# Patient Record
Sex: Female | Born: 1940 | Race: Black or African American | Hispanic: No | Smoking: Former smoker
Health system: Southern US, Community
[De-identification: ages and names within clinical notes are randomized; demographics above are authoritative.]

## PROBLEM LIST (undated history)

## (undated) DIAGNOSIS — R42 Dizziness and giddiness: Secondary | ICD-10-CM

## (undated) DIAGNOSIS — H8110 Benign paroxysmal vertigo, unspecified ear: Secondary | ICD-10-CM

## (undated) DIAGNOSIS — I10 Essential (primary) hypertension: Secondary | ICD-10-CM

## (undated) DIAGNOSIS — E039 Hypothyroidism, unspecified: Secondary | ICD-10-CM

## (undated) DIAGNOSIS — E119 Type 2 diabetes mellitus without complications: Secondary | ICD-10-CM

## (undated) DIAGNOSIS — I2510 Atherosclerotic heart disease of native coronary artery without angina pectoris: Secondary | ICD-10-CM

## (undated) DIAGNOSIS — F419 Anxiety disorder, unspecified: Secondary | ICD-10-CM

## (undated) HISTORY — PX: ABDOMINAL HYSTERECTOMY: SHX81

## (undated) HISTORY — DX: Benign paroxysmal vertigo, unspecified ear: H81.10

## (undated) HISTORY — PX: CHOLECYSTECTOMY: SHX55

## (undated) HISTORY — DX: Anxiety disorder, unspecified: F41.9

## (undated) HISTORY — PX: CORONARY ARTERY BYPASS GRAFT: SHX141

## (undated) HISTORY — DX: Essential (primary) hypertension: I10

## (undated) HISTORY — PX: APPENDECTOMY: SHX54

## (undated) HISTORY — DX: Hypothyroidism, unspecified: E03.9

## (undated) HISTORY — DX: Dizziness and giddiness: R42

## (undated) HISTORY — DX: Atherosclerotic heart disease of native coronary artery without angina pectoris: I25.10

## (undated) HISTORY — PX: TUBAL LIGATION: SHX77

## (undated) HISTORY — DX: Type 2 diabetes mellitus without complications: E11.9

---

## 1997-05-20 ENCOUNTER — Other Ambulatory Visit: Admission: RE | Admit: 1997-05-20 | Discharge: 1997-05-20 | Payer: Self-pay | Admitting: Obstetrics and Gynecology

## 1997-07-23 ENCOUNTER — Other Ambulatory Visit: Admission: RE | Admit: 1997-07-23 | Discharge: 1997-07-23 | Payer: Self-pay | Admitting: Obstetrics and Gynecology

## 1999-08-17 ENCOUNTER — Encounter: Admission: RE | Admit: 1999-08-17 | Discharge: 1999-08-17 | Payer: Self-pay | Admitting: Obstetrics and Gynecology

## 1999-08-17 ENCOUNTER — Encounter: Payer: Self-pay | Admitting: Obstetrics and Gynecology

## 2000-05-08 ENCOUNTER — Encounter: Admission: RE | Admit: 2000-05-08 | Discharge: 2000-05-08 | Payer: Self-pay | Admitting: Obstetrics and Gynecology

## 2000-05-08 ENCOUNTER — Encounter: Payer: Self-pay | Admitting: Obstetrics and Gynecology

## 2001-07-16 ENCOUNTER — Emergency Department (HOSPITAL_COMMUNITY): Admission: EM | Admit: 2001-07-16 | Discharge: 2001-07-16 | Payer: Self-pay | Admitting: Emergency Medicine

## 2010-04-21 ENCOUNTER — Emergency Department (HOSPITAL_COMMUNITY): Payer: Medicare Other

## 2010-04-21 ENCOUNTER — Observation Stay (HOSPITAL_COMMUNITY)
Admission: EM | Admit: 2010-04-21 | Discharge: 2010-04-24 | Disposition: A | Discharge status: Home / Self Care | Payer: Medicare Other | Attending: Internal Medicine | Admitting: Internal Medicine

## 2010-04-21 DIAGNOSIS — E119 Type 2 diabetes mellitus without complications: Secondary | ICD-10-CM | POA: Insufficient documentation

## 2010-04-21 DIAGNOSIS — I2510 Atherosclerotic heart disease of native coronary artery without angina pectoris: Secondary | ICD-10-CM | POA: Insufficient documentation

## 2010-04-21 DIAGNOSIS — Z79899 Other long term (current) drug therapy: Secondary | ICD-10-CM | POA: Insufficient documentation

## 2010-04-21 DIAGNOSIS — Z23 Encounter for immunization: Secondary | ICD-10-CM | POA: Insufficient documentation

## 2010-04-21 DIAGNOSIS — I10 Essential (primary) hypertension: Secondary | ICD-10-CM | POA: Insufficient documentation

## 2010-04-21 DIAGNOSIS — F411 Generalized anxiety disorder: Secondary | ICD-10-CM | POA: Insufficient documentation

## 2010-04-21 DIAGNOSIS — E039 Hypothyroidism, unspecified: Secondary | ICD-10-CM | POA: Insufficient documentation

## 2010-04-21 DIAGNOSIS — Z7982 Long term (current) use of aspirin: Secondary | ICD-10-CM | POA: Insufficient documentation

## 2010-04-21 DIAGNOSIS — Z951 Presence of aortocoronary bypass graft: Secondary | ICD-10-CM | POA: Insufficient documentation

## 2010-04-21 DIAGNOSIS — H409 Unspecified glaucoma: Secondary | ICD-10-CM | POA: Insufficient documentation

## 2010-04-21 DIAGNOSIS — H532 Diplopia: Secondary | ICD-10-CM | POA: Insufficient documentation

## 2010-04-21 DIAGNOSIS — R209 Unspecified disturbances of skin sensation: Secondary | ICD-10-CM | POA: Insufficient documentation

## 2010-04-21 DIAGNOSIS — E7800 Pure hypercholesterolemia, unspecified: Secondary | ICD-10-CM | POA: Insufficient documentation

## 2010-04-21 DIAGNOSIS — H8110 Benign paroxysmal vertigo, unspecified ear: Principal | ICD-10-CM | POA: Insufficient documentation

## 2010-04-21 LAB — PROTIME-INR
INR: 0.98 (ref 0.00–1.49)
Prothrombin Time: 13.2 seconds (ref 11.6–15.2)

## 2010-04-21 LAB — POCT CARDIAC MARKERS
CKMB, poc: <1 ng/mL — ABNORMAL LOW (ref 1.0–8.0)
Myoglobin, poc: 54.9 ng/mL (ref 12–200)
Troponin i, poc: <0.05 ng/mL (ref 0.00–0.09)

## 2010-04-21 LAB — CBC
HCT: 39 % (ref 36.0–46.0)
Hemoglobin: 12.8 g/dL (ref 12.0–15.0)
MCH: 27 pg (ref 26.0–34.0)
MCHC: 32.8 g/dL (ref 30.0–36.0)
MCV: 82.3 fL (ref 78.0–100.0)
Platelets: 217 10*3/uL (ref 150–400)
RBC: 4.74 MIL/uL (ref 3.87–5.11)
RDW: 15.4 % (ref 11.5–15.5)
WBC: 9 10*3/uL (ref 4.0–10.5)

## 2010-04-21 LAB — URINALYSIS, ROUTINE W REFLEX MICROSCOPIC
Nitrite: NEGATIVE
Specific Gravity, Urine: 1.018 (ref 1.005–1.030)
pH: 6 (ref 5.0–8.0)

## 2010-04-21 LAB — APTT: aPTT: 26 seconds (ref 24–37)

## 2010-04-21 LAB — BASIC METABOLIC PANEL
Chloride: 104 mEq/L (ref 96–112)
GFR calc non Af Amer: 51 mL/min — ABNORMAL LOW (ref >60)
Potassium: 3.8 mEq/L (ref 3.5–5.1)
Sodium: 138 mEq/L (ref 135–145)

## 2010-04-21 LAB — DIFFERENTIAL
Basophils Absolute: 0 10*3/uL (ref 0.0–0.1)
Basophils Relative: 0 % (ref 0–1)
Eosinophils Absolute: 0.1 10*3/uL (ref 0.0–0.7)
Eosinophils Relative: 1 % (ref 0–5)
Lymphocytes Relative: 29 % (ref 12–46)
Lymphs Abs: 2.6 10*3/uL (ref 0.7–4.0)
Monocytes Absolute: 0.3 10*3/uL (ref 0.1–1.0)
Monocytes Relative: 3 % (ref 3–12)
Neutro Abs: 6 10*3/uL (ref 1.7–7.7)
Neutrophils Relative %: 67 % (ref 43–77)

## 2010-04-21 LAB — HEMOGLOBIN A1C: Mean Plasma Glucose: 126 mg/dL — ABNORMAL HIGH (ref <117)

## 2010-04-21 MED ORDER — GADOBENATE DIMEGLUMINE 529 MG/ML IV SOLN
20.0000 mL | Freq: Once | INTRAVENOUS | Status: AC
  Administered 2010-04-21: 20 mL via INTRAVENOUS

## 2010-04-22 ENCOUNTER — Observation Stay (HOSPITAL_COMMUNITY): Payer: Medicare Other

## 2010-04-22 DIAGNOSIS — G459 Transient cerebral ischemic attack, unspecified: Secondary | ICD-10-CM

## 2010-04-22 DIAGNOSIS — I369 Nonrheumatic tricuspid valve disorder, unspecified: Secondary | ICD-10-CM

## 2010-04-22 LAB — COMPREHENSIVE METABOLIC PANEL
AST: 20 U/L (ref 0–37)
BUN: 13 mg/dL (ref 6–23)
CO2: 28 mEq/L (ref 19–32)
Chloride: 105 mEq/L (ref 96–112)
Creatinine, Ser: 1.13 mg/dL (ref 0.4–1.2)
GFR calc Af Amer: 57 mL/min — ABNORMAL LOW (ref >60)
GFR calc non Af Amer: 47 mL/min — ABNORMAL LOW (ref >60)
Total Bilirubin: 0.5 mg/dL (ref 0.3–1.2)

## 2010-04-22 LAB — CARDIAC PANEL(CRET KIN+CKTOT+MB+TROPI)
Total CK: 160 U/L (ref 7–177)
Troponin I: 0.01 ng/mL (ref 0.00–0.06)
Troponin I: 0.01 ng/mL (ref 0.00–0.06)

## 2010-04-22 LAB — GLUCOSE, CAPILLARY
Glucose-Capillary: 139 mg/dL — ABNORMAL HIGH (ref 70–99)
Glucose-Capillary: 99 mg/dL (ref 70–99)
Glucose-Capillary: 99 mg/dL (ref 70–99)

## 2010-04-22 LAB — LIPID PANEL: Triglycerides: 182 mg/dL — ABNORMAL HIGH (ref <150)

## 2010-04-23 LAB — GLUCOSE, CAPILLARY
Glucose-Capillary: 92 mg/dL (ref 70–99)
Glucose-Capillary: 91 mg/dL (ref 70–99)
Glucose-Capillary: 78 mg/dL (ref 70–99)

## 2010-04-23 NOTE — Consult Note (Signed)
NAME:  Jill Golden, Jill Golden NO.:  [id number]  MEDICAL RECORD NO.:  [id number]           PATIENT TYPE:  E  LOCATION:  [facility]                         FACILITY:  [facility]  PHYSICIAN:  Thana Farr, MD    DATE OF BIRTH:  Dec 04, 1940  DATE OF CONSULTATION:  04/22/2010 DATE OF DISCHARGE:                                CONSULTATION   HISTORY:  Jill Golden is a 70 year old female who reports that she awakened on the 12th with vertigo.  She describes the vertigo as the room spinning.  She also reports that she was seeing double with images one on top of the other and a little to the left.  There was also some numbness on the right side of her face.  She reports she presented for evaluation.  Today her numbness has resolved.  She continues with some vertigo but it is much less severe that it was initially.  Her diplopia has resolved as well.  She does report that on standing she is unable to walk unassisted due to being off balance.  PAST MEDICAL HISTORY:  Hypertension, hypothyroidism, diabetes, hypercholesterolemia, glaucoma, coronary artery disease status post CABG.  MEDICATIONS AT HOME:  Aspirin, atenolol, Synthroid, Maxzide, metformin, simvastatin.  ALLERGIES:  PENICILLIN.  SOCIAL HISTORY:  The patient quit smoking in 1996.  She has no history of alcohol or illicit drug abuse.  She has recently moved here from [place].  PHYSICAL EXAMINATION:  VITAL SIGNS:  Blood pressure 163/74, heart rate 59, respiratory rate 18, T-max 98.2, O2 saturation 95% on room air. NEUROLOGIC:  On mental status testing, the patient is alert and oriented x3.  Speech is fluent.  The patient can follow commands without difficulty.  On cranial nerve testing II, visual fields grossly intact. III, IV, VI extraocular movements intact.  Pupils reactive bilaterally. There is some nystagmus with left lateral gaze.  V and VII smile symmetric.  Sensation is intact on both sides of the face.  VIII  grossly intact.  IX and VII positive gag.  XI bilateral shoulder shrug.  XII midline tongue extension.  On motor exam, the patient has 5/5 strength throughout.  There is normal tone and bulk.  Sensory, pinprick and light touch are intact bilaterally.  Deep tendon reflexes are symmetric. Plantars are downgoing bilaterally.  On cerebellar testing, finger-to- nose and heel-to-shin is intact.  LABORATORY DATA:  Sodium is 140, potassium is 3.6, chloride 105, bicarb of 28, BUN and creatinine of 13 and 1.13, glucose of 88.  White blood cell count 9, platelet count 270, hemoglobin and hematocrit 12.8 and 39.0 respectively.  PT/INR and PTT 13.2, 0.98 and 26.  Bilirubin 0.5, alk phos 40, SGOT, SGPT 20 and 17, protein 5.9, albumin 3.3, calcium 8.6, hemoglobin A1c 6.0.  MRI of the head shows no acute changes.  MRA of the head and neck shows no evidence of significant stenosis or aneurysm.  Echocardiogram shows no evidence of thrombus.  ASSESSMENT:  Jill Golden is a 70 year old female that presents with acute onset dizziness, diplopia and facial numbness.  All of her symptoms have improved, they have not resolved  completely.  Imaging is not diagnostic for an infarct, but her clinical presentation does suggest such particularly with her risk factors.  She has had a complete workup for stroke.  She may likely benefit significantly from a change in her prophylactic therapy.  May need to be more aggressive in meclizine use as well.  The patient may benefit from vestibular therapy.  PLAN: 1. PT consult. 2. Continue meclizine for dizziness, may need to go to a higher dose     of 25 mg instead of 12.5 mg.   3. Discontinue aspirin. 4. Start Plavix 75 mg p.o. daily.          ______________________________ Thana Farr, MD     LR/MEDQ  D:  04/22/2010  T:  04/23/2010  Job:  [id number]  Electronically Signed by Thana Farr MD on 04/23/2010 12:53:24 PM

## 2010-04-24 LAB — GLUCOSE, CAPILLARY
Glucose-Capillary: 117 mg/dL — ABNORMAL HIGH (ref 70–99)
Glucose-Capillary: 105 mg/dL — ABNORMAL HIGH (ref 70–99)

## 2010-04-25 ENCOUNTER — Emergency Department (HOSPITAL_COMMUNITY)
Admission: EM | Admit: 2010-04-25 | Discharge: 2010-04-26 | Disposition: A | Discharge status: Home / Self Care | Payer: Medicare Other | Attending: Emergency Medicine | Admitting: Emergency Medicine

## 2010-04-25 DIAGNOSIS — R234 Changes in skin texture: Secondary | ICD-10-CM | POA: Insufficient documentation

## 2010-04-25 DIAGNOSIS — I10 Essential (primary) hypertension: Secondary | ICD-10-CM | POA: Insufficient documentation

## 2010-04-25 DIAGNOSIS — Y929 Unspecified place or not applicable: Secondary | ICD-10-CM | POA: Insufficient documentation

## 2010-04-25 DIAGNOSIS — R10819 Abdominal tenderness, unspecified site: Secondary | ICD-10-CM | POA: Insufficient documentation

## 2010-04-25 DIAGNOSIS — E119 Type 2 diabetes mellitus without complications: Secondary | ICD-10-CM | POA: Insufficient documentation

## 2010-04-25 DIAGNOSIS — Z79899 Other long term (current) drug therapy: Secondary | ICD-10-CM | POA: Insufficient documentation

## 2010-04-25 DIAGNOSIS — S301XXA Contusion of abdominal wall, initial encounter: Secondary | ICD-10-CM | POA: Insufficient documentation

## 2010-04-25 DIAGNOSIS — H409 Unspecified glaucoma: Secondary | ICD-10-CM | POA: Insufficient documentation

## 2010-04-25 DIAGNOSIS — X58XXXA Exposure to other specified factors, initial encounter: Secondary | ICD-10-CM | POA: Insufficient documentation

## 2010-04-25 LAB — DIFFERENTIAL
Basophils Absolute: 0 10*3/uL (ref 0.0–0.1)
Eosinophils Relative: 2 % (ref 0–5)
Lymphocytes Relative: 38 % (ref 12–46)
Monocytes Absolute: 0.6 10*3/uL (ref 0.1–1.0)

## 2010-04-25 LAB — CBC
HCT: 38.3 % (ref 36.0–46.0)
MCHC: 33.7 g/dL (ref 30.0–36.0)
RDW: 15.5 % (ref 11.5–15.5)

## 2010-05-09 ENCOUNTER — Encounter: Payer: Self-pay | Admitting: Internal Medicine

## 2010-05-09 ENCOUNTER — Ambulatory Visit (INDEPENDENT_AMBULATORY_CARE_PROVIDER_SITE_OTHER): Payer: Medicare Other | Admitting: Internal Medicine

## 2010-05-09 DIAGNOSIS — I2510 Atherosclerotic heart disease of native coronary artery without angina pectoris: Secondary | ICD-10-CM

## 2010-05-09 DIAGNOSIS — R51 Headache: Secondary | ICD-10-CM

## 2010-05-09 DIAGNOSIS — I10 Essential (primary) hypertension: Secondary | ICD-10-CM | POA: Insufficient documentation

## 2010-05-09 DIAGNOSIS — H8110 Benign paroxysmal vertigo, unspecified ear: Secondary | ICD-10-CM

## 2010-05-09 DIAGNOSIS — E119 Type 2 diabetes mellitus without complications: Secondary | ICD-10-CM

## 2010-05-09 DIAGNOSIS — E039 Hypothyroidism, unspecified: Secondary | ICD-10-CM

## 2010-05-09 DIAGNOSIS — R7303 Prediabetes: Secondary | ICD-10-CM | POA: Insufficient documentation

## 2010-05-09 DIAGNOSIS — I679 Cerebrovascular disease, unspecified: Secondary | ICD-10-CM

## 2010-05-09 DIAGNOSIS — Z7189 Other specified counseling: Secondary | ICD-10-CM | POA: Insufficient documentation

## 2010-05-09 DIAGNOSIS — Z951 Presence of aortocoronary bypass graft: Secondary | ICD-10-CM | POA: Insufficient documentation

## 2010-05-09 HISTORY — DX: Type 2 diabetes mellitus without complications: E11.9

## 2010-05-09 MED ORDER — LISINOPRIL 5 MG PO TABS: 5.0000 mg | ORAL_TABLET | Freq: Every day | ORAL | Status: DC

## 2010-05-09 NOTE — Patient Instructions (Signed)
Follow up in one month Please make appointment with the female physcian for PAP smear.  Nurse will call you with appointments for mammogram and colonoscopy. Welcome to out clinic!!

## 2010-05-09 NOTE — Assessment & Plan Note (Addendum)
Continue current regimen. Added lisinopril for renal protective effect. She does not recall every being on it. I educated her on potential side effects and when to stop the medications. Patient voices understanding. Later on, one can start coreg instead of atenolol and/or increase lisinopril if she could tolerate it.

## 2010-05-10 NOTE — Assessment & Plan Note (Signed)
Her A1C is at 6, she may be a candidate to come off metformin. However, her diet seems to be heavy on cakes and sweets, so she may be prediabetic who would benefit from continued metformin use.

## 2010-05-10 NOTE — Assessment & Plan Note (Signed)
Unclear reason. Asked her to discuss it with neurologist on follow up appointment.

## 2010-05-10 NOTE — Assessment & Plan Note (Signed)
Referred for mammogram, colonoscopy. She will arrange for PAP smear on next visit.

## 2010-05-10 NOTE — Assessment & Plan Note (Signed)
Symptoms have improved significantly. She may be able to come of scheduled meclizine. This should be addressed on next visit.

## 2010-05-10 NOTE — Progress Notes (Signed)
  Subjective:    Patient ID: Jill Golden, female    DOB: Nov 06, 1940, 70 y.o.   MRN: [id number]  HPI 70 years old female is here for the first appointment as hospital follow up.  She has moved form [place] area recently to be close to her sons. She has great social support. She was a former smoker.  She has hx of CAD but no chest pain at this time. She has had CABG and done well.  She was recently admitted in the hospital with TIA like symptoms/ dizziness for which neurology was consulted.  She was started on Plavix but could not afford it. I am not sure that she was a clear candidate, for plavix use, so we are going to explore avenues how she can afford it.  She has improved dizziness and continue to use meclizine.   Review of Systems  Constitutional: Negative for fever, chills, activity change and appetite change.  HENT: Negative for nosebleeds, facial swelling, neck pain and tinnitus.   Eyes: Negative for pain, discharge and visual disturbance.  Respiratory: Negative for cough, chest tightness and shortness of breath.   Cardiovascular: Negative for chest pain and palpitations.  Gastrointestinal: Negative for nausea, vomiting, abdominal pain, blood in stool and abdominal distention.  Skin: Negative for rash.  Neurological: Negative for dizziness, seizures, weakness and headaches.  Psychiatric/Behavioral: Negative for suicidal ideas, confusion and agitation.       Objective:   Physical Exam  Constitutional: She is oriented to person, place, and time. She appears well-developed and well-nourished.  HENT:  Head: Normocephalic and atraumatic.  Right Ear: External ear normal.  Left Ear: External ear normal.  Eyes: Conjunctivae and EOM are normal. Pupils are equal, round, and reactive to light.  Neck: No JVD present. No tracheal deviation present. No thyromegaly present.  Cardiovascular: Normal rate, regular rhythm and normal heart sounds.  Exam reveals no gallop.   No murmur  heard. Pulmonary/Chest: No respiratory distress. She has no wheezes. She has no rales. She exhibits no tenderness.  Abdominal: Soft. Bowel sounds are normal. She exhibits no distension and no mass. There is no tenderness. There is no rebound and no guarding.  Musculoskeletal: Normal range of motion. She exhibits no edema and no tenderness.  Lymphadenopathy:    She has no cervical adenopathy.  Neurological: She is alert and oriented to person, place, and time. She has normal reflexes. No cranial nerve deficit. Coordination normal.  Skin: No rash noted. No erythema.  Psychiatric: She has a normal mood and affect. Her behavior is normal. Thought content normal.          Assessment & Plan:

## 2010-05-10 NOTE — Assessment & Plan Note (Signed)
MRI findings, no stroke at the time of exam. Started on plavix but cant afford it. I asked her to keep taking Aspirin till she can access plavix.

## 2010-05-10 NOTE — Assessment & Plan Note (Signed)
No chest pain. Continue current regimen. She is not using any NTG.

## 2010-05-12 ENCOUNTER — Other Ambulatory Visit: Payer: Self-pay | Admitting: Internal Medicine

## 2010-05-12 DIAGNOSIS — Z7189 Other specified counseling: Secondary | ICD-10-CM

## 2010-06-01 ENCOUNTER — Telehealth: Payer: Self-pay | Admitting: Licensed Clinical Social Worker

## 2010-06-01 NOTE — Telephone Encounter (Signed)
Discussed medication resources at length with this patient.    Went over hours for [facility], how to get there, and what to bring for eligibility.   Discussed getting onto a Medicare D program and visiting with [employer] D rep to obtain a free consultation.   Sending printed materials in the mail.

## 2010-06-01 NOTE — Telephone Encounter (Signed)
Her social security income is $1,620 and then $98 is taken out for Medicare B.  She will not qualify for full subsidy under Medicare D.  She may qualify for some type of help but needs to go to [employer] to consider various plans.   In the meantime she can go to [employer] for help.

## 2010-06-14 ENCOUNTER — Ambulatory Visit: Payer: Medicare Other | Admitting: Gastroenterology

## 2010-06-20 ENCOUNTER — Encounter: Payer: Medicare Other | Admitting: Internal Medicine

## 2010-06-22 ENCOUNTER — Ambulatory Visit: Payer: Medicare Other | Admitting: Gastroenterology

## 2010-06-27 ENCOUNTER — Encounter: Payer: Medicare Other | Admitting: Internal Medicine

## 2010-06-29 ENCOUNTER — Other Ambulatory Visit: Payer: Self-pay | Admitting: *Deleted

## 2010-06-29 DIAGNOSIS — I2510 Atherosclerotic heart disease of native coronary artery without angina pectoris: Secondary | ICD-10-CM

## 2010-06-29 MED ORDER — SIMVASTATIN 40 MG PO TABS: 40.0000 mg | ORAL_TABLET | Freq: Every day | ORAL | Status: DC

## 2010-06-29 NOTE — Telephone Encounter (Signed)
Pt requests a 90 day supply if possible

## 2010-07-05 ENCOUNTER — Ambulatory Visit: Payer: Medicare Other | Admitting: Gastroenterology

## 2010-08-01 ENCOUNTER — Encounter: Payer: Self-pay | Admitting: Internal Medicine

## 2010-08-01 ENCOUNTER — Ambulatory Visit (INDEPENDENT_AMBULATORY_CARE_PROVIDER_SITE_OTHER): Payer: Medicare Other | Admitting: Internal Medicine

## 2010-08-01 ENCOUNTER — Encounter: Payer: Medicare Other | Admitting: Internal Medicine

## 2010-08-01 VITALS — BP 155/81 | HR 51 | Temp 98.6°F | Ht 68.0 in | Wt 185.9 lb

## 2010-08-01 DIAGNOSIS — R51 Headache: Secondary | ICD-10-CM

## 2010-08-01 DIAGNOSIS — I2510 Atherosclerotic heart disease of native coronary artery without angina pectoris: Secondary | ICD-10-CM

## 2010-08-01 DIAGNOSIS — H8110 Benign paroxysmal vertigo, unspecified ear: Secondary | ICD-10-CM

## 2010-08-01 DIAGNOSIS — Z7189 Other specified counseling: Secondary | ICD-10-CM

## 2010-08-01 DIAGNOSIS — I679 Cerebrovascular disease, unspecified: Secondary | ICD-10-CM

## 2010-08-01 DIAGNOSIS — Z23 Encounter for immunization: Secondary | ICD-10-CM | POA: Insufficient documentation

## 2010-08-01 DIAGNOSIS — E119 Type 2 diabetes mellitus without complications: Secondary | ICD-10-CM

## 2010-08-01 DIAGNOSIS — I10 Essential (primary) hypertension: Secondary | ICD-10-CM

## 2010-08-01 LAB — POCT GLYCOSYLATED HEMOGLOBIN (HGB A1C): Hemoglobin A1C: 5.6

## 2010-08-01 MED ORDER — ATENOLOL 25 MG PO TABS: 25.0000 mg | ORAL_TABLET | Freq: Every day | ORAL | Status: DC

## 2010-08-01 MED ORDER — ATENOLOL 50 MG PO TABS: 50.0000 mg | ORAL_TABLET | Freq: Every day | ORAL | Status: DC

## 2010-08-01 MED ORDER — LISINOPRIL 5 MG PO TABS: 5.0000 mg | ORAL_TABLET | Freq: Every day | ORAL | Status: DC

## 2010-08-01 NOTE — Assessment & Plan Note (Addendum)
Patient has not required any Meclizine. Denies any Dizziness. I will continue it as prn medication at this point.

## 2010-08-01 NOTE — Patient Instructions (Signed)
1. Please check blood glucose daily every morning before breakfast. If your blood glucose level is above 100 in the next 2 weeks please call the clinic for more instruction.  2. Stop taking Metformin.

## 2010-08-01 NOTE — Progress Notes (Signed)
  Subjective:    Patient ID: Jill Golden, female    DOB: 1940/05/22, 70 y.o.   MRN: [id number]  HPI This is a 70 year old female with PMH significant as outlined above. Patient noted that she is doing fine.  1. Occipital headache: resolved 2. HTN: did not take Lisinopril 3. DM: decreased intake of cakes, candies and other sweet things 4. BPPV: Did not experience any dizziness since the last office visit. Has been using Meclizine 5. CVA: currently on Aspirin 325 mg. Is not able to afford  Plavix even since it went generic. Did not experience any stroke like symptoms    Review of Systems  Constitutional: Negative for activity change and fatigue.  HENT: Negative for hearing loss.   Eyes: Negative for visual disturbance.  Respiratory: Negative for chest tightness.   Cardiovascular: Negative for chest pain and palpitations.  Gastrointestinal: Negative for nausea, vomiting, abdominal pain, diarrhea, constipation and abdominal distention.  Genitourinary: Negative for difficulty urinating.  Musculoskeletal: Negative for back pain, arthralgias and gait problem.  Neurological: Positive for light-headedness. Negative for dizziness, weakness, numbness and headaches.       Objective:   Physical Exam  Constitutional: She is oriented to person, place, and time. She appears well-developed.  HENT:  Head: Normocephalic.  Eyes: Pupils are equal, round, and reactive to light.  Neck: Neck supple.  Cardiovascular: Normal rate, regular rhythm and normal heart sounds.   Pulmonary/Chest: Effort normal and breath sounds normal.  Abdominal: Soft. Bowel sounds are normal. She exhibits no distension. There is no tenderness.  Musculoskeletal: Normal range of motion.  Neurological: She is alert and oriented to person, place, and time. She exhibits normal muscle tone. Coordination normal.  Skin: No rash noted.          Assessment & Plan:

## 2010-08-01 NOTE — Assessment & Plan Note (Signed)
Patient noted that she had a Mammogram, Pap smear this year. Had seen an ophthalmologist this year and has received Pneumococcus vaccination. Will obtain records.   Will discuss colonoscopy referral at the next office visit.  Received Tetanus shot today.

## 2010-08-01 NOTE — Assessment & Plan Note (Signed)
Last Hgb A1c was 6 and today it is 5.6. Patient decreased her sweet intake and is more cautious about her diet. I will d/c Metformin today since studies have shown that tight control does increase risk of cardiac events. I informed the patient that she needs to check her blood glucose level once a day in the morning before breakfast. If the blood glucose level is above 110 for the next 2 weeks she needs to call the clinic for further instruction. May need to restart Metformin at that time . Furthermore encourage the patient to continue to control diet and exercise.

## 2010-08-01 NOTE — Assessment & Plan Note (Signed)
No further episodes

## 2010-08-01 NOTE — Assessment & Plan Note (Signed)
Patient BP is elevated at the same time HR is around 50. Patient's heart rate has been low at the last office visit. Patient denies any symptoms of dizziness. Therefore I will continue current Atenolol dosage. But since BP is elevated will start Lisinopril 5 mg as recommended at the last office visit but patient did not follow recommendation. Will have her come back in 2 weeks and reevaluate BP and check Bmet at that time.

## 2010-08-02 NOTE — Progress Notes (Signed)
Addended by: Hassan Buckler on: 08/02/2010 09:05 AM   Modules accepted: Orders

## 2010-08-03 ENCOUNTER — Other Ambulatory Visit: Payer: Self-pay | Admitting: *Deleted

## 2010-08-03 DIAGNOSIS — I10 Essential (primary) hypertension: Secondary | ICD-10-CM

## 2010-08-03 DIAGNOSIS — E039 Hypothyroidism, unspecified: Secondary | ICD-10-CM

## 2010-08-03 DIAGNOSIS — E119 Type 2 diabetes mellitus without complications: Secondary | ICD-10-CM

## 2010-08-03 NOTE — Telephone Encounter (Signed)
Also pt needs clarification on Atenolol - should she be taking 25mg  or 50mg ?

## 2010-08-04 MED ORDER — TRIAMTERENE-HCTZ 37.5-25 MG PO TABS: 1.0000 | ORAL_TABLET | Freq: Every day | ORAL | Status: DC

## 2010-08-04 MED ORDER — ACCU-CHEK SAFE-T PRO LANCETS MISC: Status: DC

## 2010-08-04 MED ORDER — LEVOTHYROXINE SODIUM 50 MCG PO TABS: 50.0000 ug | ORAL_TABLET | Freq: Every day | ORAL | Status: DC

## 2010-08-04 NOTE — Telephone Encounter (Signed)
Pt states her CBG's have been 101 and 110.

## 2010-08-04 NOTE — Telephone Encounter (Signed)
I talked to Dr Loistine Chance for clarification on Atenolol - which is 50mg ; pt was called and made awared; also rx called to [facility] on [address].

## 2010-08-08 ENCOUNTER — Telehealth: Payer: Self-pay | Admitting: *Deleted

## 2010-08-08 NOTE — Telephone Encounter (Signed)
Thank you very much for the information. I agree with the plan. I informed the patient that she should  check CBG before breakfast only.   Thank you.  Almyra Deforest

## 2010-08-08 NOTE — Telephone Encounter (Signed)
Call from pt said that her sugar this am was 135.  Was told to call if it was above 110.  Pt said when asked if she had eaten.  Said that she had drank coffee and used 2 teaspoons of regular sugar and 2% milk.  Pt was informed that the sugar may have raised her sugar level.  Asked about Cereal.  Told she could have cereal with  2% milk.  Pt said that she has not talked to anyone about what foods she can eat and how.  Pt was scheduled with D. Plylor the Diabetes Educator for 08/09/2010 at 10:30 AM.  Pt was asked to bring in her Meter.

## 2010-08-09 ENCOUNTER — Ambulatory Visit: Payer: Medicare Other | Admitting: Dietician

## 2010-08-15 ENCOUNTER — Ambulatory Visit: Payer: Medicare Other | Admitting: Dietician

## 2010-08-15 ENCOUNTER — Encounter: Payer: Medicare Other | Admitting: Internal Medicine

## 2010-10-10 ENCOUNTER — Encounter: Payer: Medicare Other | Admitting: Internal Medicine

## 2010-11-07 ENCOUNTER — Encounter: Payer: Medicare Other | Admitting: Internal Medicine

## 2010-11-08 ENCOUNTER — Telehealth: Payer: Self-pay | Admitting: Dietician

## 2010-11-08 DIAGNOSIS — E119 Type 2 diabetes mellitus without complications: Secondary | ICD-10-CM

## 2010-11-08 NOTE — Telephone Encounter (Signed)
Had eye appointment at [facility] on [address] and had asked them to fax [place] a report. She has a copy of the report she will bring to her next appointment here. She says she was supposed to be seen here yesterday and visit was cancelled by our office and she does not know why. She would like to schedule with CDE on same day she sees doctor.   Will request referral from doctor [place] and assistance from front office to coordinate appointments.

## 2010-11-10 NOTE — Telephone Encounter (Signed)
Appointments scheduled for 12/19/10

## 2010-11-27 ENCOUNTER — Other Ambulatory Visit: Payer: Self-pay | Admitting: Internal Medicine

## 2010-11-28 ENCOUNTER — Other Ambulatory Visit: Payer: Self-pay | Admitting: *Deleted

## 2010-11-28 DIAGNOSIS — I10 Essential (primary) hypertension: Secondary | ICD-10-CM

## 2010-11-28 DIAGNOSIS — I2510 Atherosclerotic heart disease of native coronary artery without angina pectoris: Secondary | ICD-10-CM

## 2010-11-28 DIAGNOSIS — E039 Hypothyroidism, unspecified: Secondary | ICD-10-CM

## 2010-11-28 NOTE — Telephone Encounter (Signed)
Pt has tickling in her throat and cough. Angelina Ok, RN 11/28/2010 3:30 PM

## 2010-11-29 MED ORDER — ATENOLOL 50 MG PO TABS: 50.0000 mg | ORAL_TABLET | Freq: Every day | ORAL | Status: DC

## 2010-11-29 MED ORDER — LISINOPRIL 5 MG PO TABS: 5.0000 mg | ORAL_TABLET | Freq: Every day | ORAL | Status: DC

## 2010-11-29 MED ORDER — TRIAMTERENE-HCTZ 37.5-25 MG PO TABS: 1.0000 | ORAL_TABLET | Freq: Every day | ORAL | Status: DC

## 2010-11-29 MED ORDER — SIMVASTATIN 40 MG PO TABS: 40.0000 mg | ORAL_TABLET | Freq: Every day | ORAL | Status: DC

## 2010-11-29 MED ORDER — LEVOTHYROXINE SODIUM 50 MCG PO TABS: 50.0000 ug | ORAL_TABLET | Freq: Every day | ORAL | Status: DC

## 2010-11-30 NOTE — Telephone Encounter (Signed)
Refill was faxed to the [facility] on [address]. Angelina Ok, RN 11/30/2010 8:56 AM.

## 2010-12-12 ENCOUNTER — Encounter: Payer: Medicare Other | Admitting: Internal Medicine

## 2010-12-19 ENCOUNTER — Ambulatory Visit: Payer: Medicare Other | Admitting: Dietician

## 2010-12-19 ENCOUNTER — Encounter: Payer: Medicare Other | Admitting: Internal Medicine

## 2010-12-20 NOTE — Telephone Encounter (Signed)
Addended by: Neomia Dear on: 12/20/2010 03:59 PM   Modules accepted: Orders

## 2011-01-19 ENCOUNTER — Encounter: Payer: Medicare Other | Admitting: Internal Medicine

## 2011-01-24 ENCOUNTER — Ambulatory Visit (INDEPENDENT_AMBULATORY_CARE_PROVIDER_SITE_OTHER): Payer: Medicare Other | Admitting: Internal Medicine

## 2011-01-24 ENCOUNTER — Encounter: Payer: Self-pay | Admitting: Internal Medicine

## 2011-01-24 VITALS — BP 150/80 | HR 51 | Temp 97.3°F | Ht 68.0 in | Wt 191.5 lb

## 2011-01-24 DIAGNOSIS — E559 Vitamin D deficiency, unspecified: Secondary | ICD-10-CM

## 2011-01-24 DIAGNOSIS — E785 Hyperlipidemia, unspecified: Secondary | ICD-10-CM | POA: Diagnosis not present

## 2011-01-24 DIAGNOSIS — E119 Type 2 diabetes mellitus without complications: Secondary | ICD-10-CM | POA: Diagnosis not present

## 2011-01-24 DIAGNOSIS — I10 Essential (primary) hypertension: Secondary | ICD-10-CM

## 2011-01-24 DIAGNOSIS — Z23 Encounter for immunization: Secondary | ICD-10-CM | POA: Diagnosis not present

## 2011-01-24 DIAGNOSIS — E2839 Other primary ovarian failure: Secondary | ICD-10-CM | POA: Insufficient documentation

## 2011-01-24 LAB — COMPREHENSIVE METABOLIC PANEL
ALT: 10 U/L (ref 0–35)
Albumin: 4.3 g/dL (ref 3.5–5.2)
Alkaline Phosphatase: 51 U/L (ref 39–117)
CO2: 28 mEq/L (ref 19–32)
Potassium: 4.3 mEq/L (ref 3.5–5.3)
Sodium: 141 mEq/L (ref 135–145)
Total Bilirubin: 0.3 mg/dL (ref 0.3–1.2)
Total Protein: 7.1 g/dL (ref 6.0–8.3)

## 2011-01-24 LAB — GLUCOSE, CAPILLARY: Glucose-Capillary: 95 mg/dL (ref 70–99)

## 2011-01-24 MED ORDER — AMLODIPINE BESYLATE 5 MG PO TABS: 5.0000 mg | ORAL_TABLET | Freq: Every day | ORAL | Status: DC

## 2011-01-24 NOTE — Assessment & Plan Note (Signed)
D/c Lisinopril due to cough. Blood pressure not controlled therefore I will start Norvasc 5 mg and have the patient come back in 2 weeks for recheck and possible changes in management.

## 2011-01-24 NOTE — Assessment & Plan Note (Signed)
Well controlled with diet only. Recommended to continue to monitor diet intake and do exercise as much as possible.

## 2011-01-24 NOTE — Progress Notes (Signed)
Subjective:   Patient ID: Jill Golden female   DOB: Dec 16, 1940 71 y.o.   MRN: [id number]  HPI: Jill Golden is a 71 y.o. do with past medical history significant as outlined below who presented to the clinic a regular office visit. The patient has no complaints except: 1. Hip pain: Started yesterday. Mild pain on the bone when getting up. Sometimes the pain radiates down the groin whenever walking the pain resolves. No weakness, numbness or tingling associated with the pain. No rashes noted. 2.  Cough; patient reports since starting lisinopril she started to have a mild cough and she stopped it 3 weeks ago. Cough has improved since then.    Past Medical History  Diagnosis Date  . Diabetes mellitus   . Hypertension   . Dizziness   . Hypothyroid   . Anxiety   . BPPV (benign paroxysmal positional vertigo)   . CAD (coronary artery disease)     s/p CABG 1996   Current Outpatient Prescriptions  Medication Sig Dispense Refill  . aspirin 325 MG EC tablet Take 325 mg by mouth daily.        Marland Kitchen atenolol (TENORMIN) 50 MG tablet Take 1 tablet (50 mg total) by mouth daily.  30 tablet  3  . Cholecalciferol (VITAMIN D3) 1000 UNITS CAPS Take 1 capsule by mouth.        . Lancets (ACCU-CHEK SAFE-T PRO) lancets Check your blood sugars once a day before breakfast.  100 each  3  . levothyroxine (SYNTHROID, LEVOTHROID) 50 MCG tablet Take 1 tablet (50 mcg total) by mouth daily.  30 tablet  3  . lisinopril (PRINIVIL,ZESTRIL) 5 MG tablet Take 1 tablet (5 mg total) by mouth daily.  30 tablet  3  . meclizine (ANTIVERT) 50 MG tablet Take 50 mg by mouth 3 (three) times daily as needed.        . simvastatin (ZOCOR) 40 MG tablet Take 1 tablet (40 mg total) by mouth at bedtime.  90 tablet  3  . triamterene-hydrochlorothiazide (MAXZIDE-25) 37.5-25 MG per tablet Take 1 each (1 tablet total) by mouth daily.  30 tablet  3   Review of Systems: Constitutional: Denies fever, chills, diaphoresis, appetite  change and fatigue.  HEENT: Denies congestion, sore throat, rhinorrhea, sneezing, mouth sores, trouble swallowing Respiratory: Denies SOB, DOE, chest tightness,  and wheezing.   Cardiovascular: Denies chest pain, palpitations and leg swelling.  Gastrointestinal: Denies nausea, vomiting, abdominal pain, diarrhea, constipation, blood in stool and abdominal distention.  Genitourinary: Denies dysuria, urgency, frequency, hematuria, flank pain and difficulty urinating.  Skin: Denies pallor, rash and wound.  Neurological: Denies dizziness, , syncope, weakness, light-headedness, numbness and headaches.    Objective:  Physical Exam: Filed Vitals:   01/24/11 1348  BP: 147/82  Pulse: 51  Temp: 97.3 F (36.3 C)  TempSrc: Oral  Height: 5\' 8"  (1.727 m)  Weight: 191 lb 8 oz (86.864 kg)   Constitutional: Vital signs reviewed.  Patient is a well-developed and well-nourished  in no acute distress and cooperative with exam. Alert and oriented x3.  Neck: Supple, Trachea midline normal ROM, No JVD, mass, thyromegaly, or carotid bruit present.  Cardiovascular: RRR, S1 normal, S2 normal, pulses symmetric and intact bilaterally Pulmonary/Chest: CTAB, no wheezes, rales, or rhonchi Abdominal: Soft. Non-tender, non-distended, bowel sounds are normal,  GU: no CVA tenderness Musculoskeletal: No joint deformities, erythema, or stiffness, ROM full and no nontender Neurological: A&O x3, Strenght is normal and symmetric bilaterally, no focal motor deficit, sensory  intact to light touch bilaterally.  Skin: Warm, dry and intact. No rash, cyanosis, or clubbing.

## 2011-01-24 NOTE — Assessment & Plan Note (Signed)
I will check Vitamin D level today since patient has been taking Vitamin D without any history of deficiency. I will await results for possible changes in management.

## 2011-01-24 NOTE — Patient Instructions (Signed)
You will be started on a new medication called Amlodipine  for blood pressure .

## 2011-01-25 ENCOUNTER — Other Ambulatory Visit: Payer: Self-pay | Admitting: Internal Medicine

## 2011-01-25 DIAGNOSIS — E559 Vitamin D deficiency, unspecified: Secondary | ICD-10-CM

## 2011-01-27 ENCOUNTER — Encounter: Payer: Medicare Other | Admitting: Internal Medicine

## 2011-02-07 ENCOUNTER — Ambulatory Visit (INDEPENDENT_AMBULATORY_CARE_PROVIDER_SITE_OTHER): Payer: Medicare Other | Admitting: Internal Medicine

## 2011-02-07 ENCOUNTER — Encounter: Payer: Self-pay | Admitting: Internal Medicine

## 2011-02-07 ENCOUNTER — Other Ambulatory Visit: Payer: Self-pay | Admitting: *Deleted

## 2011-02-07 DIAGNOSIS — E119 Type 2 diabetes mellitus without complications: Secondary | ICD-10-CM

## 2011-02-07 DIAGNOSIS — E785 Hyperlipidemia, unspecified: Secondary | ICD-10-CM

## 2011-02-07 DIAGNOSIS — I10 Essential (primary) hypertension: Secondary | ICD-10-CM | POA: Diagnosis not present

## 2011-02-07 DIAGNOSIS — E559 Vitamin D deficiency, unspecified: Secondary | ICD-10-CM | POA: Diagnosis not present

## 2011-02-07 LAB — GLUCOSE, CAPILLARY: Glucose-Capillary: 141 mg/dL — ABNORMAL HIGH (ref 70–99)

## 2011-02-07 MED ORDER — ACCU-CHEK SAFE-T PRO LANCETS MISC: Status: DC

## 2011-02-07 MED ORDER — VITAMIN D 400 UNITS PO TABS: 400.0000 [IU] | ORAL_TABLET | Freq: Every day | ORAL | Status: AC

## 2011-02-07 NOTE — Assessment & Plan Note (Signed)
At goal. Continue current regimen. Lab Results  Component Value Date   CHOL  Value: 130        ATP III CLASSIFICATION:  <200     mg/dL   Desirable  161-096  mg/dL   Borderline High  >=045    mg/dL   High        04/17/8117   Lab Results  Component Value Date   HDL 35* 04/22/2010   Lab Results  Component Value Date   LDLCALC  Value: 59        Total Cholesterol/HDL:CHD Risk Coronary Heart Disease Risk Table                     Men   Women  1/2 Average Risk   3.4   3.3  Average Risk       5.0   4.4  2 X Average Risk   9.6   7.1  3 X Average Risk  23.4   11.0        Use the calculated Patient Ratio above and the CHD Risk Table to determine the patient's CHD Risk.        ATP III CLASSIFICATION (LDL):  <100     mg/dL   Optimal  147-829  mg/dL   Near or Above                    Optimal  130-159  mg/dL   Borderline  562-130  mg/dL   High  >865     mg/dL   Very High 7/84/6962   Lab Results  Component Value Date   TRIG 182* 04/22/2010   Lab Results  Component Value Date   CHOLHDL 3.7 04/22/2010   No results found for this basename: LDLDIRECT

## 2011-02-07 NOTE — Progress Notes (Signed)
  Subjective:   Patient ID: Jill Golden female   DOB: 11/09/1940 71 y.o.   MRN: [id number]  HPI: [name] is a 71 y.o. female with past medical history significant as outlined below who presented to the clinic for regular followup of her blood pressure. Patient was d/c on Lisinopril due to cough and was started on Norvasc 5 mg. Patient is tolerating it well. Denies any headache, dizziness, chest pain, SOB. Patient further noted that her cough has resolved.     Past Medical History  Diagnosis Date  . Diabetes mellitus   . Hypertension   . Dizziness   . Hypothyroid   . Anxiety   . BPPV (benign paroxysmal positional vertigo)   . CAD (coronary artery disease)     s/p CABG 1996   Current Outpatient Prescriptions  Medication Sig Dispense Refill  . amLODipine (NORVASC) 5 MG tablet Take 1 tablet (5 mg total) by mouth daily.  30 tablet  2  . aspirin 325 MG EC tablet Take 325 mg by mouth daily.        Marland Kitchen atenolol (TENORMIN) 50 MG tablet Take 1 tablet (50 mg total) by mouth daily.  30 tablet  3  . Lancets (ACCU-CHEK SAFE-T PRO) lancets Check your blood sugars once a day before breakfast.  100 each  3  . levothyroxine (SYNTHROID, LEVOTHROID) 50 MCG tablet Take 1 tablet (50 mcg total) by mouth daily.  30 tablet  3  . simvastatin (ZOCOR) 40 MG tablet Take 1 tablet (40 mg total) by mouth at bedtime.  90 tablet  3  . triamterene-hydrochlorothiazide (MAXZIDE-25) 37.5-25 MG per tablet Take 1 each (1 tablet total) by mouth daily.  30 tablet  3  . vitamin D, CHOLECALCIFEROL, 400 UNITS tablet Take 1 tablet (400 Units total) by mouth daily.       No family history on file. History   Social History  . Marital Status: Widowed    Spouse Name: N/A    Number of Children: N/A  . Years of Education: N/A   Social History Main Topics  . Smoking status: Former Smoker    Quit date: 05/09/1994  . Smokeless tobacco: None  . Alcohol Use: No  . Drug Use: No  . Sexually Active: No   Other  Topics Concern  . None   Social History Narrative  . None   Review of Systems: Constitutional: Denies fever, chills, diaphoresis, appetite change and fatigue.  Respiratory: Denies SOB, DOE, cough, chest tightness,  and wheezing.   Cardiovascular: Denies chest pain, palpitations and leg swelling.  Gastrointestinal: Denies nausea, vomiting, abdominal pain,    Skin: Denies pallor, rash and wound.  Neurological: Denies dizziness, syncope, weakness, light-headedness,  and headaches.   Objective:  Physical Exam: Filed Vitals:   02/07/11 1426 02/07/11 1521  BP: 154/81 128/79  Pulse: 56 54  Temp: 97 F (36.1 C)   TempSrc: Oral   Height: 5\' 8"  (1.727 m)   Weight: 191 lb 9.6 oz (86.909 kg)    Constitutional: Vital signs reviewed.  Patient is a well-developed and well-nourished  in no acute distress and cooperative with exam. Alert and oriented x3.  Neck: Supple,  Cardiovascular: RRR, S1 normal, S2 normal, pulses symmetric and intact bilaterally Pulmonary/Chest: CTAB, no wheezes, rales, or rhonchi Abdominal: Soft. Non-tender, non-distended, bowel sounds are normal, Neurological: A&O x3,

## 2011-02-08 NOTE — Assessment & Plan Note (Signed)
Blood pressure well controlled. Continue current regimen.  BP Readings from Last 3 Encounters:  02/07/11 128/79  01/24/11 150/80  08/01/10 155/81

## 2011-03-10 ENCOUNTER — Other Ambulatory Visit: Payer: Self-pay | Admitting: *Deleted

## 2011-03-10 DIAGNOSIS — I2510 Atherosclerotic heart disease of native coronary artery without angina pectoris: Secondary | ICD-10-CM

## 2011-03-10 DIAGNOSIS — I10 Essential (primary) hypertension: Secondary | ICD-10-CM

## 2011-03-10 DIAGNOSIS — E039 Hypothyroidism, unspecified: Secondary | ICD-10-CM

## 2011-03-10 NOTE — Telephone Encounter (Signed)
Pt would like to get 3 months at a time

## 2011-03-13 MED ORDER — TRIAMTERENE-HCTZ 37.5-25 MG PO TABS: 1.0000 | ORAL_TABLET | Freq: Every day | ORAL | Status: DC

## 2011-03-13 MED ORDER — SIMVASTATIN 40 MG PO TABS: 40.0000 mg | ORAL_TABLET | Freq: Every day | ORAL | Status: DC

## 2011-03-13 MED ORDER — ATENOLOL 50 MG PO TABS: 50.0000 mg | ORAL_TABLET | Freq: Every day | ORAL | Status: DC

## 2011-03-13 MED ORDER — LEVOTHYROXINE SODIUM 50 MCG PO TABS: 50.0000 ug | ORAL_TABLET | Freq: Every day | ORAL | Status: DC

## 2011-04-26 ENCOUNTER — Ambulatory Visit (INDEPENDENT_AMBULATORY_CARE_PROVIDER_SITE_OTHER): Payer: Medicare Other | Admitting: Internal Medicine

## 2011-04-26 ENCOUNTER — Encounter: Payer: Self-pay | Admitting: Internal Medicine

## 2011-04-26 ENCOUNTER — Encounter: Payer: Medicare Other | Admitting: Internal Medicine

## 2011-04-26 VITALS — BP 148/80 | HR 59 | Temp 97.6°F | Ht 68.0 in | Wt 194.5 lb

## 2011-04-26 DIAGNOSIS — I10 Essential (primary) hypertension: Secondary | ICD-10-CM

## 2011-04-26 DIAGNOSIS — E119 Type 2 diabetes mellitus without complications: Secondary | ICD-10-CM | POA: Diagnosis not present

## 2011-04-26 DIAGNOSIS — E039 Hypothyroidism, unspecified: Secondary | ICD-10-CM | POA: Diagnosis not present

## 2011-04-26 DIAGNOSIS — E785 Hyperlipidemia, unspecified: Secondary | ICD-10-CM

## 2011-04-26 DIAGNOSIS — I2510 Atherosclerotic heart disease of native coronary artery without angina pectoris: Secondary | ICD-10-CM

## 2011-04-26 LAB — POCT GLYCOSYLATED HEMOGLOBIN (HGB A1C): Hemoglobin A1C: 5.9

## 2011-04-26 LAB — GLUCOSE, CAPILLARY: Glucose-Capillary: 114 mg/dL — ABNORMAL HIGH (ref 70–99)

## 2011-04-26 MED ORDER — ATENOLOL 50 MG PO TABS: 50.0000 mg | ORAL_TABLET | Freq: Every day | ORAL | Status: DC

## 2011-04-26 MED ORDER — TRIAMTERENE-HCTZ 37.5-25 MG PO TABS: 1.0000 | ORAL_TABLET | Freq: Every day | ORAL | Status: DC

## 2011-04-26 MED ORDER — LEVOTHYROXINE SODIUM 50 MCG PO TABS: 50.0000 ug | ORAL_TABLET | Freq: Every day | ORAL | Status: DC

## 2011-04-26 MED ORDER — AMLODIPINE BESYLATE 5 MG PO TABS: 5.0000 mg | ORAL_TABLET | Freq: Every day | ORAL | Status: DC

## 2011-04-26 MED ORDER — SIMVASTATIN 40 MG PO TABS: 40.0000 mg | ORAL_TABLET | Freq: Every day | ORAL | Status: DC

## 2011-04-26 NOTE — Assessment & Plan Note (Signed)
Lab Results  Component Value Date   HGBA1C 5.9 04/26/2011   HGBA1C  Value: 6.0 (NOTE)                                                                       According to the ADA Clinical Practice Recommendations for 2011, when HbA1c is used as a screening test:   >=6.5%   Diagnostic of Diabetes Mellitus           (if abnormal result  is confirmed)  5.7-6.4%   Increased risk of developing Diabetes Mellitus  References:Diagnosis and Classification of Diabetes Mellitus,Diabetes Care,2011,34(Suppl 1):S62-S69 and Standards of Medical Care in         Diabetes - 2011,Diabetes Care,2011,34  (Suppl 1):S11-S61.* 04/21/2010   CREATININE 1.19* 01/24/2011   CREATININE 1.13 04/22/2010   MICROALBUR 1.90* 08/01/2010   MICRALBCREAT 10.4 08/01/2010   CHOL  Value: 130        ATP III CLASSIFICATION:  <200     mg/dL   Desirable  161-096  mg/dL   Borderline High  >=045    mg/dL   High        04/17/8117   HDL 35* 04/22/2010   TRIG 182* 04/22/2010     Assessment: Diabetes control: controlled Progress toward goals: at goal Barriers to meeting goals: no barriers identified  Plan: Diabetes treatment: Patient has not had an A1C greater than 6.5 in over a year, and has no liver disease that would make it falsely low. Furthermore, she does not take any medications for diabetes and thus will remove from problem list.

## 2011-04-26 NOTE — Progress Notes (Signed)
Addended by: Remus Blake on: 04/26/2011 02:41 PM   Modules accepted: Orders

## 2011-04-26 NOTE — Assessment & Plan Note (Signed)
Lab Results  Component Value Date   NA 141 01/24/2011   K 4.3 01/24/2011   CL 104 01/24/2011   CO2 28 01/24/2011   BUN 21 01/24/2011   CREATININE 1.19* 01/24/2011   CREATININE 1.13 04/22/2010    BP Readings from Last 3 Encounters:  04/26/11 148/80  02/07/11 128/79  01/24/11 150/80    Assessment: Hypertension control:  Mildly elevated Progress toward goals:  at goal Barriers to meeting goals:  no barriers identified  Plan: Hypertension treatment:  continue current medications

## 2011-04-26 NOTE — Assessment & Plan Note (Addendum)
Patient is asymptomatic, and has not had tsh checked in over a year. Will check today and continue current dose of synthroid.  Lab Results  Component Value Date   TSH 0.878 04/21/2010

## 2011-04-26 NOTE — Progress Notes (Signed)
Subjective:     Patient ID: Jill Golden, female   DOB: 02-23-1940, 71 y.o.   MRN: [id number]  HPI Jill Golden is a 71 year old woman with history of HTN, DM, CAD s/p BMS stenting procedure 1996, and HLD who presents to the clinic for general follow-up.  1.) DM - CBGs are usually running between 100-125. Diabetes is well controlled with diet alone. Denies hypoglycemic episodes like lightheadedness or dizziness.  2.) CAD - denies chest pain, takes statin, aspirin, and beta blocker as directed.   Patient has no other complaints or concerns today. She denies chest pain, cough, sob, headache, N/V, changes in abdominal and urinary character.   Review of Systems  All other systems reviewed and are negative.       Objective:   Physical Exam  Constitutional: She is oriented to person, place, and time. She appears well-developed.  HENT:  Head: Normocephalic.  Eyes: Pupils are equal, round, and reactive to light.  Neck: Normal range of motion. Neck supple. No JVD present.  Cardiovascular: Normal rate, regular rhythm, normal heart sounds and intact distal pulses.   Pulmonary/Chest: Effort normal and breath sounds normal.  Abdominal: Soft. Bowel sounds are normal.  Musculoskeletal: Normal range of motion.  Neurological: She is alert and oriented to person, place, and time.

## 2011-04-26 NOTE — Patient Instructions (Signed)
Please follow up in 6 months. Please take all medications as directed. 

## 2011-04-26 NOTE — Assessment & Plan Note (Signed)
Lab Results  Component Value Date   CHOL  Value: 130        ATP III CLASSIFICATION:  <200     mg/dL   Desirable  161-096  mg/dL   Borderline High  >=045    mg/dL   High        04/17/8117   HDL 35* 04/22/2010   LDLCALC  Value: 59        Total Cholesterol/HDL:CHD Risk Coronary Heart Disease Risk Table                     Men   Women  1/2 Average Risk   3.4   3.3  Average Risk       5.0   4.4  2 X Average Risk   9.6   7.1  3 X Average Risk  23.4   11.0        Use the calculated Patient Ratio above and the CHD Risk Table to determine the patient's CHD Risk.        ATP III CLASSIFICATION (LDL):  <100     mg/dL   Optimal  147-829  mg/dL   Near or Above                    Optimal  130-159  mg/dL   Borderline  562-130  mg/dL   High  >865     mg/dL   Very High 7/84/6962   TRIG 182* 04/22/2010   CHOLHDL 3.7 04/22/2010   Cholesterol is well controlled with statin and diet. Will check today.

## 2011-04-26 NOTE — Assessment & Plan Note (Signed)
Stable, without chest pain. Continue asa, statin, and beta blocker. Continue risk factor management.

## 2011-04-27 LAB — LIPID PANEL: HDL: 41 mg/dL (ref >39)

## 2011-04-27 LAB — TSH: TSH: 2.756 u[IU]/mL (ref 0.350–4.500)

## 2011-08-07 ENCOUNTER — Ambulatory Visit (INDEPENDENT_AMBULATORY_CARE_PROVIDER_SITE_OTHER): Payer: Medicare Other | Admitting: Internal Medicine

## 2011-08-07 ENCOUNTER — Encounter: Payer: Self-pay | Admitting: Internal Medicine

## 2011-08-07 VITALS — BP 138/72 | HR 50 | Temp 98.2°F | Ht 68.0 in | Wt 195.7 lb

## 2011-08-07 DIAGNOSIS — I10 Essential (primary) hypertension: Secondary | ICD-10-CM | POA: Diagnosis not present

## 2011-08-07 DIAGNOSIS — I2510 Atherosclerotic heart disease of native coronary artery without angina pectoris: Secondary | ICD-10-CM | POA: Diagnosis not present

## 2011-08-07 DIAGNOSIS — I679 Cerebrovascular disease, unspecified: Secondary | ICD-10-CM

## 2011-08-07 DIAGNOSIS — E039 Hypothyroidism, unspecified: Secondary | ICD-10-CM | POA: Diagnosis not present

## 2011-08-07 DIAGNOSIS — E785 Hyperlipidemia, unspecified: Secondary | ICD-10-CM

## 2011-08-07 DIAGNOSIS — E119 Type 2 diabetes mellitus without complications: Secondary | ICD-10-CM

## 2011-08-07 DIAGNOSIS — R51 Headache: Secondary | ICD-10-CM

## 2011-08-07 DIAGNOSIS — E559 Vitamin D deficiency, unspecified: Secondary | ICD-10-CM

## 2011-08-07 LAB — CBC
MCH: 26.7 pg (ref 26.0–34.0)
MCHC: 33.5 g/dL (ref 30.0–36.0)
Platelets: 246 10*3/uL (ref 150–400)
RBC: 4.79 MIL/uL (ref 3.87–5.11)

## 2011-08-07 LAB — BASIC METABOLIC PANEL
BUN: 18 mg/dL (ref 6–23)
CO2: 27 mEq/L (ref 19–32)
Calcium: 10 mg/dL (ref 8.4–10.5)
Chloride: 106 mEq/L (ref 96–112)
Creat: 1.33 mg/dL — ABNORMAL HIGH (ref 0.50–1.10)

## 2011-08-07 LAB — GLUCOSE, CAPILLARY: Glucose-Capillary: 112 mg/dL — ABNORMAL HIGH (ref 70–99)

## 2011-08-07 MED ORDER — ASPIRIN 325 MG PO TBEC: 325.0000 mg | DELAYED_RELEASE_TABLET | Freq: Every day | ORAL | Status: DC

## 2011-08-07 MED ORDER — LEVOTHYROXINE SODIUM 50 MCG PO TABS: 50.0000 ug | ORAL_TABLET | Freq: Every day | ORAL | Status: DC

## 2011-08-07 MED ORDER — TRIAMTERENE-HCTZ 37.5-25 MG PO TABS: 1.0000 | ORAL_TABLET | Freq: Every day | ORAL | Status: DC

## 2011-08-07 MED ORDER — PRAVASTATIN SODIUM 40 MG PO TABS: 40.0000 mg | ORAL_TABLET | Freq: Every day | ORAL | Status: DC

## 2011-08-07 MED ORDER — ATENOLOL 50 MG PO TABS: 50.0000 mg | ORAL_TABLET | Freq: Every day | ORAL | Status: DC

## 2011-08-07 NOTE — Assessment & Plan Note (Signed)
Resolved

## 2011-08-07 NOTE — Assessment & Plan Note (Signed)
Continue Vitamin D 400 units

## 2011-08-07 NOTE — Assessment & Plan Note (Signed)
I will change simvastatin 40 mg to pravastatin 80 mg since she was paying 40 $ . LDL was at goal with 96 , 3 month ago

## 2011-08-07 NOTE — Progress Notes (Signed)
Subjective:   Patient ID: Jill Golden female   DOB: [birth year] 71 y.o.   MRN: [id number]  HPI: [name] is a 71 y.o.  Female with PMH significant as outlined below who presented to the clinic for a regular office visit.  Patient reported that she is doing fine except that she occasionally experiences a feeling as she is swimming when she get's up out of bed . It is does not occur everyday. It is not aggravated or alleviated by any factors. It is no associated with headache, nausea, chest pain, SOB. It does not occur when she stands up from a sitting position.    Past Medical History  Diagnosis Date  . Diabetes mellitus   . Hypertension   . Dizziness   . Hypothyroid   . Anxiety   . BPPV (benign paroxysmal positional vertigo)   . CAD (coronary artery disease)     s/p CABG 1996   Current Outpatient Prescriptions  Medication Sig Dispense Refill  . amLODipine (NORVASC) 5 MG tablet Take 1 tablet (5 mg total) by mouth daily.  90 tablet  3  . aspirin 325 MG EC tablet Take 325 mg by mouth daily.        Marland Kitchen atenolol (TENORMIN) 50 MG tablet Take 1 tablet (50 mg total) by mouth daily.  90 tablet  3  . Lancets (ACCU-CHEK SAFE-T PRO) lancets Check your blood sugars once a day before breakfast.  100 each  3  . levothyroxine (SYNTHROID, LEVOTHROID) 50 MCG tablet Take 1 tablet (50 mcg total) by mouth daily.  90 tablet  3  . simvastatin (ZOCOR) 40 MG tablet Take 1 tablet (40 mg total) by mouth at bedtime.  90 tablet  3  . triamterene-hydrochlorothiazide (MAXZIDE-25) 37.5-25 MG per tablet Take 1 each (1 tablet total) by mouth daily.  90 tablet  3  . vitamin D, CHOLECALCIFEROL, 400 UNITS tablet Take 1 tablet (400 Units total) by mouth daily.       No family history on file. History   Social History  . Marital Status: Widowed    Spouse Name: N/A    Number of Children: N/A  . Years of Education: N/A   Social History Main Topics  . Smoking status: Former Smoker    Quit date:  05/09/1994  . Smokeless tobacco: None  . Alcohol Use: No  . Drug Use: No  . Sexually Active: No   Other Topics Concern  . None   Social History Narrative  . None   Review of Systems: Constitutional: Denies fever, chills, diaphoresis, appetite change and fatigue.  HEENT: Denies neck pain, neck stiffness and tinnitus.   Respiratory: Denies SOB, DOE, cough, chest tightness,  and wheezing.   Cardiovascular: Denies chest pain, palpitations and leg swelling.  Gastrointestinal: Denies nausea, vomiting, abdominal pain, diarrhea, constipation, blood in stool and abdominal distention.  Genitourinary: Denies dysuria, urgency, frequency, hematuria, flank pain and difficulty urinating.  Skin: Denies pallor, rash and wound.  Neurological: Denies ,  syncope, weakness,  numbness and headaches.    Objective:  Physical Exam: Filed Vitals:   08/07/11 1418 08/07/11 1429  BP: 139/65 134/71  Pulse: 55 50  Temp: 98.2 F (36.8 C)   TempSrc: Oral   Height: 5\' 8"  (1.727 m)   Weight: 195 lb 11.2 oz (88.769 kg)   SpO2: 98%    Constitutional: Vital signs reviewed.  Patient is a well-developed and well-nourished female in no acute distress and cooperative with exam. Alert and oriented  x3.  Neck: Supple,  Cardiovascular: RRR, S1 normal, S2 normal, no MRG, pulses symmetric and intact bilaterally Pulmonary/Chest: CTAB, no wheezes, rales, or rhonchi Abdominal: Soft. Non-tender, non-distended, bowel sounds are normal, no masses, organomegaly, or guarding present.  Neurological: A&O x3,no focal motor deficit, sensory intact to light touch bilaterally.  Skin: Warm, dry and intact. No rash, cyanosis, or clubbing.

## 2011-08-07 NOTE — Assessment & Plan Note (Signed)
Hgb A1c is 6. Patient's Hgb A1c has been in the 6 range for the last 2 years on no medication. I will not check her A1c any more . Foot exam was performed today and it was within normal limits.  We will refer patient to Ophthalmology .

## 2011-08-07 NOTE — Assessment & Plan Note (Signed)
Patient was not taking Norvasc as recommended  In the past when her blood pressure was elevated to the 150s. Today BP is 130s/70s . I will therefore continue with Atenol and Maxzide.  I will obtain bmet for possible changes in management

## 2011-08-07 NOTE — Assessment & Plan Note (Signed)
TSH 2.7 ( 3 month ago). I will continue current regimen. Patient was started on Synthroid by her PCP in [place] years ago.

## 2011-08-08 ENCOUNTER — Other Ambulatory Visit: Payer: Self-pay | Admitting: Internal Medicine

## 2011-08-08 DIAGNOSIS — I10 Essential (primary) hypertension: Secondary | ICD-10-CM

## 2011-08-08 DIAGNOSIS — N182 Chronic kidney disease, stage 2 (mild): Secondary | ICD-10-CM

## 2011-08-21 ENCOUNTER — Other Ambulatory Visit (INDEPENDENT_AMBULATORY_CARE_PROVIDER_SITE_OTHER): Payer: Medicare Other

## 2011-08-21 DIAGNOSIS — I10 Essential (primary) hypertension: Secondary | ICD-10-CM

## 2011-08-21 LAB — BASIC METABOLIC PANEL
BUN: 16 mg/dL (ref 6–23)
CO2: 26 mEq/L (ref 19–32)
Glucose, Bld: 101 mg/dL — ABNORMAL HIGH (ref 70–99)
Potassium: 4 mEq/L (ref 3.5–5.3)
Sodium: 140 mEq/L (ref 135–145)

## 2011-08-22 ENCOUNTER — Encounter: Payer: Self-pay | Admitting: Internal Medicine

## 2011-11-01 NOTE — Addendum Note (Signed)
Addended by: Neomia Dear on: 11/01/2011 09:18 AM   Modules accepted: Orders

## 2011-11-06 ENCOUNTER — Encounter: Payer: Medicare Other | Admitting: Internal Medicine

## 2011-11-13 ENCOUNTER — Encounter: Payer: Self-pay | Admitting: Internal Medicine

## 2011-11-13 NOTE — Progress Notes (Signed)
Patient ID: Jill Golden, female   DOB: March 07, 1940, 71 y.o.   MRN: [id number]  This patient is a CHRONIC NO-SHOW PATIENT that has a history of HYPERTENSION.  Please make sure to address hypertension during her next clinic appointment, and intervene as appropriate.    Within the AVS, please incorporate the following smartphrase: .htntips   Pertinent Data: BP Readings from Last 3 Encounters:  08/07/11 138/72  04/26/11 148/80  02/07/11 128/79    BMI: Estimated Body mass index is 29.76 kg/(m^2) as calculated from the following:   Height as of 08/07/11: 5\' 8" (1.727 m).   Weight as of 08/07/11: 195 lb 11.2 oz(88.769 kg).  Smoking History: History  Smoking status  . Former Smoker  . Quit date: 05/09/1994  Smokeless tobacco  . Not on file    Last Basic Metabolic Panel:    Component Value Date/Time   NA 140 08/21/2011 1350   K 4.0 08/21/2011 1350   CL 106 08/21/2011 1350   CO2 26 08/21/2011 1350   BUN 16 08/21/2011 1350   CREATININE 1.15* 08/21/2011 1350   CREATININE 1.13 04/22/2010 0148   GLUCOSE 101* 08/21/2011 1350   CALCIUM 9.9 08/21/2011 1350    Allergies: Allergies  Allergen Reactions  . Lisinopril     Cough  . Penicillins

## 2011-12-11 ENCOUNTER — Encounter: Payer: Medicare Other | Admitting: Internal Medicine

## 2012-01-31 ENCOUNTER — Encounter: Payer: Self-pay | Admitting: Internal Medicine

## 2012-03-04 ENCOUNTER — Encounter: Payer: Self-pay | Admitting: Internal Medicine

## 2012-03-11 ENCOUNTER — Other Ambulatory Visit: Payer: Self-pay | Admitting: *Deleted

## 2012-03-11 ENCOUNTER — Encounter: Payer: Medicare Other | Admitting: Internal Medicine

## 2012-03-11 DIAGNOSIS — I679 Cerebrovascular disease, unspecified: Secondary | ICD-10-CM

## 2012-03-11 MED ORDER — LEVOTHYROXINE SODIUM 50 MCG PO TABS: 50.0000 ug | ORAL_TABLET | Freq: Every day | ORAL | Status: DC

## 2012-03-11 MED ORDER — PRAVASTATIN SODIUM 40 MG PO TABS: 40.0000 mg | ORAL_TABLET | Freq: Every day | ORAL | Status: DC

## 2012-03-11 MED ORDER — ATENOLOL 50 MG PO TABS: 50.0000 mg | ORAL_TABLET | Freq: Every day | ORAL | Status: DC

## 2012-03-11 MED ORDER — TRIAMTERENE-HCTZ 37.5-25 MG PO TABS: 1.0000 | ORAL_TABLET | Freq: Every day | ORAL | Status: DC

## 2012-03-11 NOTE — Telephone Encounter (Signed)
Appt scheduled for today was canceled d/t weather.

## 2012-03-13 ENCOUNTER — Telehealth: Payer: Self-pay | Admitting: *Deleted

## 2012-03-13 NOTE — Telephone Encounter (Signed)
Pharmacy asking to switch from a [employer] brand to a [employer] brand on Levothyroxine.  It's a NTI drug Please advise

## 2012-03-14 NOTE — Telephone Encounter (Signed)
That should not be a problem.  Thanks Almyra Deforest

## 2012-03-14 NOTE — Telephone Encounter (Signed)
Pharmacy informed.

## 2012-04-01 ENCOUNTER — Encounter: Payer: Medicare Other | Admitting: Internal Medicine

## 2012-04-10 ENCOUNTER — Ambulatory Visit: Payer: Medicare Other | Admitting: Radiation Oncology

## 2012-04-18 ENCOUNTER — Encounter (HOSPITAL_COMMUNITY): Payer: Self-pay | Admitting: *Deleted

## 2012-04-18 ENCOUNTER — Emergency Department (HOSPITAL_COMMUNITY)
Admission: EM | Admit: 2012-04-18 | Discharge: 2012-04-18 | Disposition: A | Discharge status: Home / Self Care | Payer: Medicare Other | Attending: Emergency Medicine | Admitting: Emergency Medicine

## 2012-04-18 ENCOUNTER — Emergency Department (HOSPITAL_COMMUNITY): Payer: Medicare Other

## 2012-04-18 DIAGNOSIS — IMO0002 Reserved for concepts with insufficient information to code with codable children: Secondary | ICD-10-CM | POA: Insufficient documentation

## 2012-04-18 DIAGNOSIS — Y9289 Other specified places as the place of occurrence of the external cause: Secondary | ICD-10-CM | POA: Insufficient documentation

## 2012-04-18 DIAGNOSIS — S0003XA Contusion of scalp, initial encounter: Secondary | ICD-10-CM | POA: Insufficient documentation

## 2012-04-18 DIAGNOSIS — W010XXA Fall on same level from slipping, tripping and stumbling without subsequent striking against object, initial encounter: Secondary | ICD-10-CM | POA: Insufficient documentation

## 2012-04-18 DIAGNOSIS — Z8669 Personal history of other diseases of the nervous system and sense organs: Secondary | ICD-10-CM | POA: Diagnosis not present

## 2012-04-18 DIAGNOSIS — Y9301 Activity, walking, marching and hiking: Secondary | ICD-10-CM | POA: Insufficient documentation

## 2012-04-18 DIAGNOSIS — Z7982 Long term (current) use of aspirin: Secondary | ICD-10-CM | POA: Insufficient documentation

## 2012-04-18 DIAGNOSIS — I10 Essential (primary) hypertension: Secondary | ICD-10-CM | POA: Diagnosis not present

## 2012-04-18 DIAGNOSIS — Z87891 Personal history of nicotine dependence: Secondary | ICD-10-CM | POA: Insufficient documentation

## 2012-04-18 DIAGNOSIS — S0031XA Abrasion of nose, initial encounter: Secondary | ICD-10-CM

## 2012-04-18 DIAGNOSIS — S0990XA Unspecified injury of head, initial encounter: Secondary | ICD-10-CM | POA: Diagnosis not present

## 2012-04-18 DIAGNOSIS — Z951 Presence of aortocoronary bypass graft: Secondary | ICD-10-CM | POA: Diagnosis not present

## 2012-04-18 DIAGNOSIS — I2510 Atherosclerotic heart disease of native coronary artery without angina pectoris: Secondary | ICD-10-CM | POA: Insufficient documentation

## 2012-04-18 DIAGNOSIS — Z8659 Personal history of other mental and behavioral disorders: Secondary | ICD-10-CM | POA: Diagnosis not present

## 2012-04-18 DIAGNOSIS — E039 Hypothyroidism, unspecified: Secondary | ICD-10-CM | POA: Insufficient documentation

## 2012-04-18 DIAGNOSIS — S1093XA Contusion of unspecified part of neck, initial encounter: Secondary | ICD-10-CM | POA: Insufficient documentation

## 2012-04-18 DIAGNOSIS — R51 Headache: Secondary | ICD-10-CM | POA: Diagnosis not present

## 2012-04-18 DIAGNOSIS — S0083XA Contusion of other part of head, initial encounter: Secondary | ICD-10-CM

## 2012-04-18 DIAGNOSIS — E119 Type 2 diabetes mellitus without complications: Secondary | ICD-10-CM | POA: Insufficient documentation

## 2012-04-18 DIAGNOSIS — Z79899 Other long term (current) drug therapy: Secondary | ICD-10-CM | POA: Insufficient documentation

## 2012-04-18 MED ORDER — ACETAMINOPHEN 325 MG PO TABS
650.0000 mg | ORAL_TABLET | ORAL | Status: AC
  Administered 2012-04-18: 650 mg via ORAL
  Filled 2012-04-18: qty 2

## 2012-04-18 NOTE — ED Notes (Signed)
Pt tripped over curb and landed on face.  Abrasions to nose and forehead.  Pt c/o shooting pain to forehead and L 4th finger.  Denies loc or being on any anticoagulants.

## 2012-04-18 NOTE — ED Notes (Signed)
Patient transported to CT 

## 2012-04-18 NOTE — ED Provider Notes (Signed)
History    This chart was scribed for non-physician practitioner working with Suzi Roots, MD by Donne Anon, ED Scribe. This patient was seen in room TR11C/TR11C and the patient's care was started at 1632.   CSN: [id number]  Arrival date & time 04/18/12  1555   First MD Initiated Contact with Patient 04/18/12 1632      Chief Complaint  Patient presents with  . Fall    HPI Jill Golden is a 72 y.o. female who presents to the Emergency Department complaining of sudden onset, constant, generalized face and hand pain due to a fall which occurred 3 hours PTA; unchanged since onset. She states she was walking and tripped over the curb and landed on her face. She reports associated burning, constant, non radiating central  forehead pain and knee abrasions. She denies epistaxis, vision changes, LOC, neck pain or stiffness, light headedness or dizziness, numbness, tingling, and difficulty swallowing.   Past Medical History  Diagnosis Date  . Diabetes mellitus   . Hypertension   . Dizziness   . Anxiety   . BPPV (benign paroxysmal positional vertigo)   . CAD (coronary artery disease)     4 stents placed prior to CABAG (1996 ? ) , s/p CABG 1996   . Diabetes mellitus, type 2 05/09/2010  . Hypothyroid     Past Surgical History  Procedure Laterality Date  . Coronary artery bypass graft    . Abdominal hysterectomy    . Tubal ligation    . Cholecystectomy    . Appendectomy      No family history on file.  History  Substance Use Topics  . Smoking status: Former Smoker    Quit date: 05/09/1994  . Smokeless tobacco: Not on file  . Alcohol Use: No   Review of Systems  HENT: Negative for nosebleeds, trouble swallowing, neck pain, neck stiffness and tinnitus.   Eyes: Negative for visual disturbance.  Gastrointestinal: Negative for nausea and vomiting.  Musculoskeletal: Positive for arthralgias.  Skin: Positive for color change and wound. Negative for pallor.  Neurological:  Negative for dizziness, syncope, weakness, light-headedness and numbness.  Psychiatric/Behavioral: Negative for confusion.    Allergies  Lisinopril and Penicillins  Home Medications   Current Outpatient Rx  Name  Route  Sig  Dispense  Refill  . aspirin 325 MG EC tablet   Oral   Take 325 mg by mouth daily.         Marland Kitchen atenolol (TENORMIN) 50 MG tablet   Oral   Take 50 mg by mouth daily.         Marland Kitchen levothyroxine (SYNTHROID, LEVOTHROID) 50 MCG tablet   Oral   Take 50 mcg by mouth daily.         . pravastatin (PRAVACHOL) 40 MG tablet   Oral   Take 40 mg by mouth daily.         Marland Kitchen triamterene-hydrochlorothiazide (MAXZIDE-25) 37.5-25 MG per tablet   Oral   Take 1 tablet by mouth daily.         . vitamin D, CHOLECALCIFEROL, 400 UNITS tablet   Oral   Take 400 Units by mouth daily.           BP 167/75  Pulse 52  Temp(Src) 98.8 F (37.1 C) (Oral)  SpO2 100%  LMP 05/09/1982  Physical Exam  Nursing note and vitals reviewed. Constitutional: She is oriented to person, place, and time. She appears well-developed and well-nourished. No distress.  HENT:  Head:  Normocephalic and atraumatic.  Nose: Nose normal. No epistaxis.  Mouth/Throat: Oropharynx is clear and moist. No oropharyngeal exudate.  Contusion and abrasion to forehead. Abrasion to the nasal bone. Symmetric rise of the uvula with phonation.  Eyes: Conjunctivae and EOM are normal. Pupils are equal, round, and reactive to light. No scleral icterus.  Gross visual acuity normal.  Neck: Normal range of motion. Neck supple. No tracheal deviation present.  No tenderness on palpation of the cervical spine. Cervical ROM normal.  Cardiovascular: Normal rate, regular rhythm, normal heart sounds and intact distal pulses.   Distal radial pulses 2+ b/l. Capillary refill <2 seconds b/l.  Pulmonary/Chest: Effort normal and breath sounds normal. No respiratory distress. She has no wheezes. She has no rales.  Musculoskeletal:  Normal range of motion.  Equal grip strength and 5/5 strength to FDS, FDP, and extensors of b/l hands. ROM of b/l knees normal.  Lymphadenopathy:    She has no cervical adenopathy.  Neurological: She is alert and oriented to person, place, and time. No cranial nerve deficit.  Skin: Skin is warm and dry. Abrasion noted. She is not diaphoretic. No pallor.  Superficial abrasions to the 3rd, 4th, 5th PIP joint. Tenderness on palpation to left 4th DIP joint.  Psychiatric: She has a normal mood and affect. Her behavior is normal.    ED Course  Procedures (including critical care time) DIAGNOSTIC STUDIES: Oxygen Saturation is 100% on room air, normal by my interpretation.    COORDINATION OF CARE: 4:52 PM Discussed treatment plan which includes CT scan with pt at bedside and pt agreed to plan.    Ct Head Wo Contrast  04/18/2012  *RADIOLOGY REPORT*  Clinical Data: Fall. Trauma to face.  Head pain.  CT HEAD WITHOUT CONTRAST  Technique:  Contiguous axial images were obtained from the base of the skull through the vertex without contrast.  Comparison:  MRI brain 04/21/2010.  CT head 04/21/2010.  Findings: No acute cortical infarct, hemorrhage, or mass lesion is present.  The left supraorbital scalp soft tissue swelling is present.  There is no underlying fracture.  The globes and orbits are intact.  Atrophy and minimal white matter disease is similar to the prior study.  The ventricles are normal size.  No significant extra-axial fluid collection is present.  IMPRESSION:  1.  Stable atrophy and mild white matter disease. 2.  No acute intracranial abnormality. 3.  Minimal left supraorbital scalp soft tissue swelling without an underlying fracture.   Original Report Authenticated By: Marin Roberts, M.D.     Labs Reviewed - No data to display No results found.   1. Forehead contusion, initial encounter   2. Nasal abrasion, initial encounter     MDM  Forehead contusion and nasal abrasion 2/2  trip and fall on curb today. CT head without acute intracranial hemorrhage or abnormality. Patient neurovascularly intact with no CN deficit or sensory or motor deficits. Well and nontoxic appearing, in good spirits and no acute distress. Stable for d/c with PCP follow up. Tylenol or ibuprofen advised for discomfort. Indications for ED return discussed. Patient states comfort and understanding with this d/c plan with no unaddressed concerns.  I personally performed the services described in this documentation, which was scribed in my presence. The recorded information has been reviewed and is accurate.          Antony Madura, PA-C 04/21/12 2107

## 2012-04-23 NOTE — ED Provider Notes (Signed)
Medical screening examination/treatment/procedure(s) were performed by non-physician practitioner and as supervising physician I was immediately available for consultation/collaboration.   Jasten Guyette E Lawernce Earll, MD 04/23/12 1400 

## 2012-04-25 ENCOUNTER — Ambulatory Visit (INDEPENDENT_AMBULATORY_CARE_PROVIDER_SITE_OTHER): Payer: Medicare Other | Admitting: Radiation Oncology

## 2012-04-25 ENCOUNTER — Encounter: Payer: Self-pay | Admitting: Radiation Oncology

## 2012-04-25 VITALS — BP 132/85 | HR 56 | Temp 98.6°F | Ht 68.0 in | Wt 192.9 lb

## 2012-04-25 DIAGNOSIS — Z8739 Personal history of other diseases of the musculoskeletal system and connective tissue: Secondary | ICD-10-CM | POA: Insufficient documentation

## 2012-04-25 DIAGNOSIS — M109 Gout, unspecified: Secondary | ICD-10-CM

## 2012-04-25 DIAGNOSIS — I10 Essential (primary) hypertension: Secondary | ICD-10-CM | POA: Diagnosis not present

## 2012-04-25 HISTORY — DX: Personal history of other diseases of the musculoskeletal system and connective tissue: Z87.39

## 2012-04-25 MED ORDER — IBUPROFEN 200 MG PO TABS: 600.0000 mg | ORAL_TABLET | Freq: Four times a day (QID) | ORAL | Status: DC | PRN

## 2012-04-25 NOTE — Assessment & Plan Note (Signed)
BP Readings from Last 3 Encounters:  04/25/12 132/85  04/18/12 167/75  08/07/11 138/72    Lab Results  Component Value Date   NA 140 08/21/2011   K 4.0 08/21/2011   CREATININE 1.15* 08/21/2011    Assessment: Blood pressure control: controlled Progress toward BP goal:  at goal Comments:   Plan: Medications:  continue current medications Educational resources provided: brochure;video Self management tools provided: home blood pressure logbook Other plans:

## 2012-04-25 NOTE — Progress Notes (Signed)
Subjective:    Patient ID: Jill Golden, female    DOB: May 13, 1940, 72 y.o.   MRN: [id number]  HPI Patient is a 72 year old woman with PMH as described below who presents to clinic today for followup after a recent ED visit for a fall. The patient states the fall was mechanical in nature and occurred when she tripped on a curb while walking outdoors. She suffered abrasions to her face into her hands, however she states that this issue is no longer bothering her today.   She states that she developed a gout flare two days ago in her left foot, which is her only complaint today. The patient states that the left foot became red, swollen, and painful approximately 48 hours ago and has improved somewhat since then. Denies any recent trauma of the left foot. She admits to a history of gout, and is not on any long-term anti-hyperuricemic medications. She is taking Aleve when necessary with fair improvement in pain.   Review of Systems  Constitutional: Negative for fever and chills.  HENT: Negative for nosebleeds and facial swelling.   Eyes: Negative.   Respiratory: Negative for cough and shortness of breath.   Cardiovascular: Negative for chest pain and leg swelling.  Gastrointestinal: Negative for nausea, vomiting, abdominal pain and diarrhea.  Endocrine: Negative.   Genitourinary: Negative for dysuria and hematuria.  Musculoskeletal: Negative.   Skin: Negative.   Allergic/Immunologic: Negative.   Neurological: Negative for light-headedness and headaches.  Hematological: Negative.   Psychiatric/Behavioral: Negative.    Current Outpatient Medications: Current Outpatient Prescriptions  Medication Sig Dispense Refill  . aspirin 325 MG EC tablet Take 325 mg by mouth daily.      Marland Kitchen atenolol (TENORMIN) 50 MG tablet Take 50 mg by mouth daily.      Marland Kitchen ibuprofen (ADVIL) 200 MG tablet Take 3-4 tablets (600-800 mg total) by mouth every 6 (six) hours as needed for pain.  100 tablet  2  .  levothyroxine (SYNTHROID, LEVOTHROID) 50 MCG tablet Take 50 mcg by mouth daily.      . pravastatin (PRAVACHOL) 40 MG tablet Take 40 mg by mouth daily.      Marland Kitchen triamterene-hydrochlorothiazide (MAXZIDE-25) 37.5-25 MG per tablet Take 1 tablet by mouth daily.      . vitamin D, CHOLECALCIFEROL, 400 UNITS tablet Take 400 Units by mouth daily.       No current facility-administered medications for this visit.    Allergies: Allergies  Allergen Reactions  . Lisinopril     Cough  . Penicillins      Past Medical History: Past Medical History  Diagnosis Date  . Diabetes mellitus   . Hypertension   . Dizziness   . Anxiety   . BPPV (benign paroxysmal positional vertigo)   . CAD (coronary artery disease)     4 stents placed prior to CABAG (1996 ? ) , s/p CABG 1996   . Diabetes mellitus, type 2 05/09/2010  . Hypothyroid     Past Surgical History: Past Surgical History  Procedure Laterality Date  . Coronary artery bypass graft    . Abdominal hysterectomy    . Tubal ligation    . Cholecystectomy    . Appendectomy      Family History: No family history on file.  Social History: History   Social History  . Marital Status: Widowed    Spouse Name: N/A    Number of Children: N/A  . Years of Education: N/A   Occupational History  .  Not on file.   Social History Main Topics  . Smoking status: Former Smoker    Quit date: 05/09/1994  . Smokeless tobacco: Not on file  . Alcohol Use: No  . Drug Use: No  . Sexually Active: No   Other Topics Concern  . Not on file   Social History Narrative  . No narrative on file     Vital Signs: Blood pressure 132/85, pulse 56, temperature 98.6 F (37 C), temperature source Oral, height 5\' 8"  (1.727 m), weight 192 lb 14.4 oz (87.499 kg), last menstrual period 05/09/1982, SpO2 98.00%.       Objective:   Physical Exam  Constitutional: She is oriented to person, place, and time. She appears well-developed and well-nourished. No distress.   HENT:  Head: Normocephalic and atraumatic.  Nasal abrasions. No ecchymoses or edema.   Eyes: Conjunctivae are normal. Pupils are equal, round, and reactive to light. No scleral icterus.  Neck: Normal range of motion. Neck supple. No tracheal deviation present.  Cardiovascular: Normal rate and regular rhythm.   No murmur heard. Pulmonary/Chest: Effort normal. She has no wheezes. She has no rales.  Abdominal: Soft. Bowel sounds are normal. She exhibits no distension. There is no tenderness.  Musculoskeletal: Normal range of motion.  L foot: Edema, erythema, and TTP of 1st metatarsophalangeal joint.   Neurological: She is alert and oriented to person, place, and time. No cranial nerve deficit.  Skin: Skin is warm and dry.  Psychiatric: She has a normal mood and affect. Her behavior is normal.          Assessment & Plan:

## 2012-04-25 NOTE — Assessment & Plan Note (Addendum)
Pt was prescribed ibuprofen for acute gout flare. Pt has a family history of gout, and states she has had gout attacks for ~2 years. Of note, she takes HCTZ, however she has taken this for >20 years, thus it will be continued at this time. As she has a history of gout with multiple flares in the past and also appears to have renal insufficiency, it would be worthwhile to initiate allopurinol therapy at the time of her next visit (if she is not having an acute flare).  - ibuprofen 600-800mg  q6h PRN - at next visit: start allopurinol and check serum uric acid (to establish baseline)

## 2012-04-25 NOTE — Patient Instructions (Signed)
Take ibuprofen as needed for your gout flare. Contact us if your symptoms worsen or fail to improve.  Please return to clinic in approximately 2-3 months for your annual visit with your PCP.   It was a pleasure meeting you. Have a great day.   Gout Gout is an inflammatory condition (arthritis) caused by a buildup of uric acid crystals in the joints. Uric acid is a chemical that is normally present in the blood. Under some circumstances, uric acid can form into crystals in your joints. This causes joint redness, soreness, and swelling (inflammation). Repeat attacks are common. Over time, uric acid crystals can form into masses (tophi) near a joint, causing disfigurement. Gout is treatable and often preventable. CAUSES  The disease begins with elevated levels of uric acid in the blood. Uric acid is produced by your body when it breaks down a naturally found substance called purines. This also happens when you eat certain foods such as meats and fish. Causes of an elevated uric acid level include:  Being passed down from parent to child (heredity).  Diseases that cause increased uric acid production (obesity, psoriasis, some cancers).  Excessive alcohol use.  Diet, especially diets rich in meat and seafood.  Medicines, including certain cancer-fighting drugs (chemotherapy), diuretics, and aspirin.  Chronic kidney disease. The kidneys are no longer able to remove uric acid well.  Problems with metabolism. Conditions strongly associated with gout include:  Obesity.  High blood pressure.  High cholesterol.  Diabetes. Not everyone with elevated uric acid levels gets gout. It is not understood why some people get gout and others do not. Surgery, joint injury, and eating too much of certain foods are some of the factors that can lead to gout. SYMPTOMS   An attack of gout comes on quickly. It causes intense pain with redness, swelling, and warmth in a joint.  Fever can occur.  Often,  only one joint is involved. Certain joints are more commonly involved:  Base of the big toe.  Knee.  Ankle.  Wrist.  Finger. Without treatment, an attack usually goes away in a few days to weeks. Between attacks, you usually will not have symptoms, which is different from many other forms of arthritis. DIAGNOSIS  Your caregiver will suspect gout based on your symptoms and exam. Removal of fluid from the joint (arthrocentesis) is done to check for uric acid crystals. Your caregiver will give you a medicine that numbs the area (local anesthetic) and use a needle to remove joint fluid for exam. Gout is confirmed when uric acid crystals are seen in joint fluid, using a special microscope. Sometimes, blood, urine, and X-ray tests are also used. TREATMENT  There are 2 phases to gout treatment: treating the sudden onset (acute) attack and preventing attacks (prophylaxis). Treatment of an Acute Attack  Medicines are used. These include anti-inflammatory medicines or steroid medicines.  An injection of steroid medicine into the affected joint is sometimes necessary.  The painful joint is rested. Movement can worsen the arthritis.  You may use warm or cold treatments on painful joints, depending which works best for you.  Discuss the use of coffee, vitamin C, or cherries with your caregiver. These may be helpful treatment options. Treatment to Prevent Attacks After the acute attack subsides, your caregiver may advise prophylactic medicine. These medicines either help your kidneys eliminate uric acid from your body or decrease your uric acid production. You may need to stay on these medicines for a very long time. The early  phase of treatment with prophylactic medicine can be associated with an increase in acute gout attacks. For this reason, during the first few months of treatment, your caregiver may also advise you to take medicines usually used for acute gout treatment. Be sure you understand  your caregiver's directions. You should also discuss dietary treatment with your caregiver. Certain foods such as meats and fish can increase uric acid levels. Other foods such as dairy can decrease levels. Your caregiver can give you a list of foods to avoid. HOME CARE INSTRUCTIONS   Do not take aspirin to relieve pain. This raises uric acid levels.  Only take over-the-counter or prescription medicines for pain, discomfort, or fever as directed by your caregiver.  Rest the joint as much as possible. When in bed, keep sheets and blankets off painful areas.  Keep the affected joint raised (elevated).  Use crutches if the painful joint is in your leg.  Drink enough water and fluids to keep your urine clear or pale yellow. This helps your body get rid of uric acid. Do not drink alcoholic beverages. They slow the passage of uric acid.  Follow your caregiver's dietary instructions. Pay careful attention to the amount of protein you eat. Your daily diet should emphasize fruits, vegetables, whole grains, and fat-free or low-fat milk products.  Maintain a healthy body weight. SEEK MEDICAL CARE IF:   You have an oral temperature above 102 F (38.9 C).  You develop diarrhea, vomiting, or any side effects from medicines.  You do not feel better in 24 hours, or you are getting worse. SEEK IMMEDIATE MEDICAL CARE IF:   Your joint becomes suddenly more tender and you have:  Chills.  An oral temperature above 102 F (38.9 C), not controlled by medicine. MAKE SURE YOU:   Understand these instructions.  Will watch your condition.  Will get help right away if you are not doing well or get worse. Document Released: 12/24/1999 Document Revised: 03/20/2011 Document Reviewed: 04/05/2009 [facility] Patient Information 2013 [place], [place].

## 2012-05-13 ENCOUNTER — Encounter: Payer: Medicare Other | Admitting: Internal Medicine

## 2012-05-23 ENCOUNTER — Encounter: Payer: Self-pay | Admitting: Internal Medicine

## 2012-05-24 ENCOUNTER — Telehealth: Payer: Self-pay | Admitting: *Deleted

## 2012-05-24 ENCOUNTER — Other Ambulatory Visit: Payer: Self-pay | Admitting: Internal Medicine

## 2012-05-24 DIAGNOSIS — M109 Gout, unspecified: Secondary | ICD-10-CM

## 2012-05-24 MED ORDER — COLCHICINE 0.6 MG PO TABS: 0.6000 mg | ORAL_TABLET | Freq: Every day | ORAL | Status: DC

## 2012-05-24 NOTE — Telephone Encounter (Signed)
Dr Loistine Chance has sent in Colchicine rx to [facility] - pt called and informed.

## 2012-05-24 NOTE — Telephone Encounter (Signed)
Message left from pt - requesting something for gout; stated right ankle swollen and having difficulty walking. Has an appt Tues 5/20 but would like something now. Thanks

## 2012-05-28 ENCOUNTER — Ambulatory Visit: Payer: Medicare Other | Admitting: Internal Medicine

## 2012-07-03 ENCOUNTER — Telehealth: Payer: Self-pay | Admitting: *Deleted

## 2012-07-03 NOTE — Telephone Encounter (Signed)
Pt informed of Dr Gwenlyn Fudge response.

## 2012-07-03 NOTE — Telephone Encounter (Signed)
Return pt's call - requesting a letter to be written to her apartment's office. She lives at [address] at [facility]; when she move in about a month ago, she was not told that it will be a handicapped apartment.  So, they have to lower the cabinets about 2 inches to meet regulations. And she states she's tall and this will be an inconvience and will be a strain d/t her health. She was told by the complex to have her doctor write a letter. Otherwise she will have to move which she doesn't want to.  Thanks Fax to #[phone].

## 2012-07-03 NOTE — Telephone Encounter (Signed)
Unfortunately I would be not able to provide a letter. Patient has no restriction from a medical stand point. Therefore no indication is present at this point to provide a letter   Jill Golden

## 2012-07-18 ENCOUNTER — Other Ambulatory Visit: Payer: Self-pay

## 2012-08-06 ENCOUNTER — Encounter: Payer: Medicare Other | Admitting: Internal Medicine

## 2012-08-08 ENCOUNTER — Other Ambulatory Visit: Payer: Self-pay | Admitting: Internal Medicine

## 2012-08-09 NOTE — Telephone Encounter (Signed)
High no show. So only 3 month supply

## 2012-08-13 ENCOUNTER — Ambulatory Visit (INDEPENDENT_AMBULATORY_CARE_PROVIDER_SITE_OTHER): Payer: Medicare Other | Admitting: Internal Medicine

## 2012-08-13 VITALS — BP 154/78 | HR 87 | Temp 97.3°F | Resp 20 | Ht 66.75 in | Wt 191.2 lb

## 2012-08-13 DIAGNOSIS — E039 Hypothyroidism, unspecified: Secondary | ICD-10-CM | POA: Diagnosis not present

## 2012-08-13 DIAGNOSIS — G4700 Insomnia, unspecified: Secondary | ICD-10-CM | POA: Diagnosis not present

## 2012-08-13 DIAGNOSIS — E785 Hyperlipidemia, unspecified: Secondary | ICD-10-CM

## 2012-08-13 DIAGNOSIS — Z7189 Other specified counseling: Secondary | ICD-10-CM | POA: Diagnosis not present

## 2012-08-13 DIAGNOSIS — I10 Essential (primary) hypertension: Secondary | ICD-10-CM

## 2012-08-13 DIAGNOSIS — Z0000 Encounter for general adult medical examination without abnormal findings: Secondary | ICD-10-CM

## 2012-08-13 MED ORDER — LEVOTHYROXINE SODIUM 50 MCG PO TABS: 50.0000 ug | ORAL_TABLET | Freq: Every day | ORAL | Status: DC

## 2012-08-13 MED ORDER — PRAVASTATIN SODIUM 40 MG PO TABS: 40.0000 mg | ORAL_TABLET | Freq: Every day | ORAL | Status: DC

## 2012-08-13 MED ORDER — ATENOLOL 50 MG PO TABS: 50.0000 mg | ORAL_TABLET | Freq: Every day | ORAL | Status: DC

## 2012-08-13 MED ORDER — TRIAMTERENE-HCTZ 37.5-25 MG PO TABS: 1.0000 | ORAL_TABLET | Freq: Every day | ORAL | Status: DC

## 2012-08-13 MED ORDER — LORAZEPAM 1 MG PO TABS: 0.5000 mg | ORAL_TABLET | Freq: Every evening | ORAL | Status: DC | PRN

## 2012-08-13 NOTE — Assessment & Plan Note (Signed)
I have prescribed Zostavax.

## 2012-08-13 NOTE — Assessment & Plan Note (Signed)
BP Readings from Last 3 Encounters:  08/13/12 154/78  04/25/12 132/85  04/18/12 167/75    Lab Results  Component Value Date   NA 140 08/21/2011   K 4.0 08/21/2011   CREATININE 1.15* 08/21/2011    Assessment: Blood pressure control: controlled Progress toward BP goal:  at goal   Plan: Medications:  continue current medications Educational resources provided: brochure Self management tools provided: home blood pressure logbook;instructions for home blood pressure monitoring Other plans: none

## 2012-08-13 NOTE — Progress Notes (Signed)
Case discussed with Dr. Kazibwe soon after the resident saw the patient.  We reviewed the resident's history and exam and pertinent patient test results.  I agree with the assessment, diagnosis, and plan of care documented in the resident's note. 

## 2012-08-13 NOTE — Patient Instructions (Addendum)
Please start taking Ativan 0.5 mg at bed time as needed for sleep  Please come back to see your doctor in 6 months Please take your medications as prescribed  Treatment Goals:  Goals (1 Years of Data) as of 08/13/12         As of Today 04/25/12 04/18/12 08/07/11 04/26/11     Blood Pressure    . Blood Pressure < 130/80  154/78 132/85 167/75 138/72      Result Component    . HEMOGLOBIN A1C < 7.0     6.0     . LDL CALC < 70      96      Progress Toward Treatment Goals:  Treatment Goal 08/13/2012  Blood pressure at goal  Prevent falls at goal    Self Care Goals & Plans:  Self Care Goal 08/13/2012  Manage my medications take my medicines as prescribed; refill my medications on time; bring my medications to every visit  Monitor my health keep track of my blood pressure; bring my blood pressure log to each visit  Eat healthy foods eat foods that are low in salt; drink diet soda or water instead of juice or soda; eat baked foods instead of fried foods  Be physically active find workout friends; take a walk every day       Care Management & Community Referrals:       Insomnia Insomnia is frequent trouble falling and/or staying asleep. Insomnia can be a long term problem or a short term problem. Both are common. Insomnia can be a short term problem when the wakefulness is related to a certain stress or worry. Long term insomnia is often related to ongoing stress during waking hours and/or poor sleeping habits. Overtime, sleep deprivation itself can make the problem worse. Every little thing feels more severe because you are overtired and your ability to cope is decreased. CAUSES   Stress, anxiety, and depression.  Poor sleeping habits.  Distractions such as TV in the bedroom.  Naps close to bedtime.  Engaging in emotionally charged conversations before bed.  Technical reading before sleep.  Alcohol and other sedatives. They may make the problem worse. They can hurt normal sleep  patterns and normal dream activity.  Stimulants such as caffeine for several hours prior to bedtime.  Pain syndromes and shortness of breath can cause insomnia.  Exercise late at night.  Changing time zones may cause sleeping problems (jet lag). It is sometimes helpful to have someone observe your sleeping patterns. They should look for periods of not breathing during the night (sleep apnea). They should also look to see how long those periods last. If you live alone or observers are uncertain, you can also be observed at a sleep clinic where your sleep patterns will be professionally monitored. Sleep apnea requires a checkup and treatment. Give your caregivers your medical history. Give your caregivers observations your family has made about your sleep.  SYMPTOMS   Not feeling rested in the morning.  Anxiety and restlessness at bedtime.  Difficulty falling and staying asleep. TREATMENT   Your caregiver may prescribe treatment for an underlying medical disorders. Your caregiver can give advice or help if you are using alcohol or other drugs for self-medication. Treatment of underlying problems will usually eliminate insomnia problems.  Medications can be prescribed for short time use. They are generally not recommended for lengthy use.  Over-the-counter sleep medicines are not recommended for lengthy use. They can be habit forming.  You  can promote easier sleeping by making lifestyle changes such as:  Using relaxation techniques that help with breathing and reduce muscle tension.  Exercising earlier in the day.  Changing your diet and the time of your last meal. No night time snacks.  Establish a regular time to go to bed.  Counseling can help with stressful problems and worry.  Soothing music and white noise may be helpful if there are background noises you cannot remove.  Stop tedious detailed work at least one hour before bedtime. HOME CARE INSTRUCTIONS   Keep a diary.  Inform your caregiver about your progress. This includes any medication side effects. See your caregiver regularly. Take note of:  Times when you are asleep.  Times when you are awake during the night.  The quality of your sleep.  How you feel the next day. This information will help your caregiver care for you.  Get out of bed if you are still awake after 15 minutes. Read or do some quiet activity. Keep the lights down. Wait until you feel sleepy and go back to bed.  Keep regular sleeping and waking hours. Avoid naps.  Exercise regularly.  Avoid distractions at bedtime. Distractions include watching television or engaging in any intense or detailed activity like attempting to balance the household checkbook.  Develop a bedtime ritual. Keep a familiar routine of bathing, brushing your teeth, climbing into bed at the same time each night, listening to soothing music. Routines increase the success of falling to sleep faster.  Use relaxation techniques. This can be using breathing and muscle tension release routines. It can also include visualizing peaceful scenes. You can also help control troubling or intruding thoughts by keeping your mind occupied with boring or repetitive thoughts like the old concept of counting sheep. You can make it more creative like imagining planting one beautiful flower after another in your backyard garden.  During your day, work to eliminate stress. When this is not possible use some of the previous suggestions to help reduce the anxiety that accompanies stressful situations. MAKE SURE YOU:   Understand these instructions.  Will watch your condition.  Will get help right away if you are not doing well or get worse. Document Released: 12/24/1999 Document Revised: 03/20/2011 Document Reviewed: 01/23/2007 [facility] Patient Information 2014 [place], [place].

## 2012-08-13 NOTE — Assessment & Plan Note (Signed)
Patient reports trouble falling asleep. No use of stimulants at bedtime. No distraction which can explain this sleep onset insomnia. I have started her on Ativan 0.5 mg at bedtime as needed for sleep. I have counseled her about the possible side effects of this medications, including sedation, dizziness, and the risk of falls. I have also printed out more information of insomnia. If this continues to be a problem, Ativan can be increased to 1 mg or even 2 mg as needed for sleep.

## 2012-08-13 NOTE — Progress Notes (Signed)
Patient ID: Jill Golden, female   DOB: [birth year], 72 y.o.   MRN: [id number]          HPI: [name] is a 72 y.o. with past medical history of hypertension, coronary artery disease, hyperlipidemia, hypothyroidism, and gout, presents for a wellness followup clinic visit. Patient was last seen more than a year ago. She recently received a letter in the mail reminding her to come for a visit.  She reports compliance with her medications, including atenolol, Maxzide, pravastatin, Synthroid, and aspirin. She does not check BP at home.   She reports complains of insomnia with failure to fall asleep. Once she falls asleep, she is able to get at for 4 hours without awakening. She requests for something to help her sleep. She did not tolerate Benadryl in the past due to S/E. She denies distractions in the bedroom, like excessive light, TV, or noise. She also denies history of using of caffeine of other stimulants as night time. She does not take alcohol. She takes a coffee at around 11 AM in the morning and only once a day.     She was unable to fill her prescription of colchicine due to cost. No gout flares.     Past Medical History  Diagnosis Date  . Diabetes mellitus   . Hypertension   . Dizziness   . Anxiety   . BPPV (benign paroxysmal positional vertigo)   . CAD (coronary artery disease)     4 stents placed prior to CABAG (1996 ? ) , s/p CABG 1996   . Diabetes mellitus, type 2 05/09/2010  . Hypothyroid    Current Outpatient Prescriptions  Medication Sig Dispense Refill  . aspirin 325 MG EC tablet Take 325 mg by mouth daily.      Marland Kitchen atenolol (TENORMIN) 50 MG tablet Take 1 tablet (50 mg total) by mouth daily.  90 tablet  3  . levothyroxine (SYNTHROID, LEVOTHROID) 50 MCG tablet Take 1 tablet (50 mcg total) by mouth daily before breakfast.  90 tablet  3  . LORazepam (ATIVAN) 1 MG tablet Take 0.5 tablets (0.5 mg total) by mouth at bedtime as needed for anxiety.  30 tablet  0    . pravastatin (PRAVACHOL) 40 MG tablet Take 1 tablet (40 mg total) by mouth daily.  90 tablet  3  . triamterene-hydrochlorothiazide (MAXZIDE-25) 37.5-25 MG per tablet Take 1 tablet by mouth daily.  90 tablet  3  . vitamin D, CHOLECALCIFEROL, 400 UNITS tablet Take 400 Units by mouth daily.       No current facility-administered medications for this visit.   No family history on file. History   Social History  . Marital Status: Widowed    Spouse Name: N/A    Number of Children: N/A  . Years of Education: N/A   Social History Main Topics  . Smoking status: Former Smoker    Quit date: 05/09/1994  . Smokeless tobacco: Not on file  . Alcohol Use: No  . Drug Use: No  . Sexually Active: No   Other Topics Concern  . Not on file   Social History Narrative  . No narrative on file    Review of Systems: Constitutional: Denies fever, chills, diaphoresis, appetite change and fatigue.  Respiratory: Denies SOB, DOE, cough, chest tightness, and wheezing.  Cardiovascular: No chest pain, palpitations and leg swelling.  Gastrointestinal: No abdominal pain, nausea, vomiting, bloody stools Genitourinary: No dysuria, frequency, hematuria, or flank pain.  Musculoskeletal: No myalgias, back  pain, joint swelling, arthralgias    Objective:  Physical Exam: Filed Vitals:   08/13/12 1633  BP: 154/78  Pulse: 87  Temp: 97.3 F (36.3 C)  TempSrc: Oral  Resp: 20  Height: 5' 6.75" (1.695 m)  Weight: 191 lb 3.2 oz (86.728 kg)  SpO2: 97%   General: Well nourished. No acute distress.  Lungs: CTA bilaterally. Heart: RRR; no extra sounds or murmurs  Abdomen: Non-distended, normal BS, soft, nontender; no hepatosplenomegaly  Extremities: No pedal edema. No joint swelling or tenderness. Neurologic: Alert and oriented x3. No obvious neurologic deficits.  Assessment & Plan:  I have discussed my assessment and plan  with Dr. Aundria Rud as detailed under problem based charting.

## 2012-08-13 NOTE — Addendum Note (Signed)
Addended by: Neomia Dear on: 08/13/2012 05:35 PM   Modules accepted: Orders

## 2012-08-14 ENCOUNTER — Encounter: Payer: Self-pay | Admitting: *Deleted

## 2012-08-14 MED ORDER — ZOSTER VACCINE LIVE 19400 UNT/0.65ML ~~LOC~~ SOLR: 0.6500 mL | Freq: Once | SUBCUTANEOUS | Status: DC

## 2012-08-14 NOTE — Addendum Note (Signed)
Addended by: Dow Adolph on: 08/14/2012 07:59 AM   Modules accepted: Orders

## 2012-11-12 ENCOUNTER — Ambulatory Visit: Payer: Medicare Other | Admitting: Internal Medicine

## 2012-12-02 ENCOUNTER — Ambulatory Visit (INDEPENDENT_AMBULATORY_CARE_PROVIDER_SITE_OTHER): Payer: Medicare Other | Admitting: Internal Medicine

## 2012-12-02 ENCOUNTER — Encounter: Payer: Self-pay | Admitting: Internal Medicine

## 2012-12-02 VITALS — BP 152/81 | HR 42 | Temp 97.4°F | Ht 68.0 in | Wt 193.8 lb

## 2012-12-02 DIAGNOSIS — M109 Gout, unspecified: Secondary | ICD-10-CM

## 2012-12-02 DIAGNOSIS — Z23 Encounter for immunization: Secondary | ICD-10-CM | POA: Diagnosis not present

## 2012-12-02 DIAGNOSIS — Z0000 Encounter for general adult medical examination without abnormal findings: Secondary | ICD-10-CM

## 2012-12-02 DIAGNOSIS — N183 Chronic kidney disease, stage 3 unspecified: Secondary | ICD-10-CM | POA: Diagnosis not present

## 2012-12-02 DIAGNOSIS — I10 Essential (primary) hypertension: Secondary | ICD-10-CM

## 2012-12-02 DIAGNOSIS — E785 Hyperlipidemia, unspecified: Secondary | ICD-10-CM

## 2012-12-02 DIAGNOSIS — N1831 Chronic kidney disease, stage 3a: Secondary | ICD-10-CM | POA: Insufficient documentation

## 2012-12-02 DIAGNOSIS — E039 Hypothyroidism, unspecified: Secondary | ICD-10-CM | POA: Diagnosis not present

## 2012-12-02 DIAGNOSIS — I129 Hypertensive chronic kidney disease with stage 1 through stage 4 chronic kidney disease, or unspecified chronic kidney disease: Secondary | ICD-10-CM

## 2012-12-02 LAB — TSH: TSH: 4.217 u[IU]/mL (ref 0.350–4.500)

## 2012-12-02 LAB — BASIC METABOLIC PANEL WITH GFR
BUN: 16 mg/dL (ref 6–23)
CO2: 28 mEq/L (ref 19–32)
Chloride: 104 mEq/L (ref 96–112)
Creat: 1.32 mg/dL — ABNORMAL HIGH (ref 0.50–1.10)
GFR, Est Non African American: 40 mL/min — ABNORMAL LOW
Potassium: 4.3 mEq/L (ref 3.5–5.3)

## 2012-12-02 LAB — LIPID PANEL
HDL: 39 mg/dL — ABNORMAL LOW (ref >39)
Total CHOL/HDL Ratio: 4.2 Ratio
VLDL: 35 mg/dL (ref 0–40)

## 2012-12-02 MED ORDER — ATENOLOL 50 MG PO TABS: 50.0000 mg | ORAL_TABLET | Freq: Every day | ORAL | Status: DC

## 2012-12-02 MED ORDER — PRAVASTATIN SODIUM 40 MG PO TABS: 40.0000 mg | ORAL_TABLET | Freq: Every day | ORAL | Status: DC

## 2012-12-02 MED ORDER — TRIAMTERENE-HCTZ 37.5-25 MG PO TABS: 1.0000 | ORAL_TABLET | Freq: Every day | ORAL | Status: DC

## 2012-12-02 MED ORDER — LEVOTHYROXINE SODIUM 50 MCG PO TABS: 50.0000 ug | ORAL_TABLET | Freq: Every day | ORAL | Status: DC

## 2012-12-02 NOTE — Assessment & Plan Note (Addendum)
Lipids checked today.  ADDENDUM: TGs slightly elevated, but total cholesterol and LDL are well controlled. HDL is low at 39. Continue current statin (pravastatin 40mg  daily).

## 2012-12-02 NOTE — Assessment & Plan Note (Addendum)
Check TSH today. No symptoms of hyper or hypothyroidism (though patient is bradycardic- appears to be a chronic issue).  ADDENDUM: TSH is wnl.

## 2012-12-02 NOTE — Assessment & Plan Note (Signed)
BP Readings from Last 3 Encounters:  12/02/12 152/81  08/13/12 154/78  04/25/12 132/85    Lab Results  Component Value Date   NA 140 08/21/2011   K 4.0 08/21/2011   CREATININE 1.15* 08/21/2011    Assessment: Blood pressure control:  fair Progress toward BP goal:   unchanged Comments: BP slightly elevated at her last two visits  Plan: Medications:  continue current medications Educational resources provided:   Self management tools provided:  patient was encouraged to keep a log of BPs at home (she does have a BP cuff) and to bring this with her at her next visit Other plans: Continue current regimen, but will recheck BP at her f/u visit in 3 months and consider altering her regimen if it remains elevated. I also advised her to decrease her salt intake and asked her not to add salt to her foods. She exercises 4 times per week (walks 1-2 miles each time), which I encouraged her to continue. She is contemplating joining a gym as well, which I think is a great idea.

## 2012-12-02 NOTE — Assessment & Plan Note (Signed)
Patient does not want a controller medication. Previously unable to afford colchicine. Her flares do not seem to bother her to the point where she needs stronger pain medication. She takes Advil approx 1 time per month, which is appropriate, though she does have some compromise of her kidney function. I asked that she avoid taking this medication any more than she has been. I recommend Tylenol for other aches and pains of daily life.

## 2012-12-02 NOTE — Assessment & Plan Note (Addendum)
Unclear etiology. She is not diabetic. This may be 2/2 uncontrolled blood pressure. Her last eGFR was 47 in 2012. Cr on 08/2011 was 1.15. -BMP today   ADDENDUM: Cr is 1.32, eGFR is 46. No significant progression of her CKD since 2012, therefore no further intervention at this time. May be worthwhile to repeat BMP at her next PCP visit in order to assess progression given there is a mild increase in Cr.

## 2012-12-02 NOTE — Assessment & Plan Note (Signed)
Flu shot given today. Patient has rx for Zostavax and has tried multiple times to obtain vaccination. Her pharmacy has been out of this medication when she has gone. She plans to try again until she is able to receive the vaccine.

## 2012-12-02 NOTE — Patient Instructions (Signed)
Thank you for your visit.  Today I refilled all of your medications.  We checked your kidney function and electrolytes, your thyroid as well as your cholesterol today.  Please make a daily blood pressure log and bring it to your next visit. Please avoid eating excess salt as well. This may help your blood pressure, which is slightly elevated today.   Please follow up with me in three months.

## 2012-12-02 NOTE — Progress Notes (Signed)
Patient ID: Jill Golden, female   DOB: [birth year], 72 y.o.   MRN: [id number] HPI The patient is a 72 y.o. female with a history of hypertension, CAD, hypothyroidism, HLD, gout who presents to clinic for routine visit.  Gout-Patient reports having pain to her right fifth digit DIP joint since yesterday, pain is 10/10. Patient also reports having some swelling and redness to this joint. She has gout flares in mostly her hands 1-2 times every 7-8 months, each episode lasts a couple of days. Patient takes Advil one pill per month. Her gout pain is tolerable and does not affect her day-to-day activities. She does not want to be on any controller medication. Patient had been on colchicine in the past but was unable to afford it so has not been taking it.  HTN- Patient's blood pressure was elevated 171/73 today, recheck 152/81. Heart rate is low 46, then 42 on repeat. Patient's blood pressure at her last visit in August 2014 was also mildly elevated. Patient denies headache, vision changes, dizziness, palpitations, chest pain, shortness of breath. She is compliant with her atenolol and Maxzide.  Insomnia-Patient had been seen in August 2014 for insomnia and difficulty maintaining sleep for which she was prescribed Ativan 0.5 mg each bedtime. Patient did not get this prescription filled and does not complaining of insomnia at this time. She now is getting 7-8 hours of sleep every night. She reports that after he stopped watching TV late at night, her sleep improved.  Health maintenance- Patient was prescribed Zostavax at her last visit. She did try to get this vaccine, but has attempted to get it done twice at her pharmacy both times she was told they were out of this medication. She still has the prescription and will get this done at some point. Patient wants the flu shot today. Patient had been incorrectly entered as a diabetic in our computer system, therefore our records note she is overdue for  ophthalmology exam, urine microalbumin, lipid panel when in fact she is not overdue for these.  ROS: General: no fevers, chills, changes in weight, changes in appetite  Skin: no rash HEENT: no blurry vision, hearing changes, sore throat Pulm: no dyspnea, coughing, wheezing CV: no chest pain, palpitations, shortness of breath Abd: no abdominal pain, nausea/vomiting, diarrhea/constipation GU: no dysuria, hematuria, polyuria Ext: see HPI Neuro: no weakness, numbness, or tingling, dizziness  Filed Vitals:   12/02/12 1051  BP: 152/81  Pulse: 42  Temp:   SpO2 98% on r/a  PEX General: very pleasant elderly woman who is alert, cooperative, and in no apparent distress HEENT: pupils equal round and reactive to light, clear conjunctivae, vision grossly intact, oropharynx clear and non-erythematous  Neck: supple, no lymphadenopathy, JVD Lungs: clear to ascultation bilaterally, normal work of respiration, no wheezes, rales, ronchi Heart: regular rate and rhythm, no murmurs, gallops, or rubs Abdomen: soft, non-tender, non-distended, normal bowel sounds Extremities: warm extremities b/l; DP pulses 2+ b/l; no BLE edema; R fifth digit DIP w/ erythema and mild swelling, +TTP, normal AROM; no other joints affected Neurologic: alert & oriented X3, cranial nerves II-XII grossly intact, strength grossly intact, sensation intact to light touch  Current Outpatient Prescriptions on File Prior to Visit  Medication Sig Dispense Refill  . aspirin 325 MG EC tablet Take 325 mg by mouth daily.      . vitamin D, CHOLECALCIFEROL, 400 UNITS tablet Take 400 Units by mouth daily.      Marland Kitchen zoster vaccine live, PF, (ZOSTAVAX) [id number]  UNT/0.65ML injection Inject 19,400 Units into the skin once.  1 each  0   No current facility-administered medications on file prior to visit.    Assessment/Plan

## 2012-12-04 ENCOUNTER — Encounter: Payer: Medicare Other | Admitting: Internal Medicine

## 2012-12-10 NOTE — Progress Notes (Signed)
I saw and evaluated the patient.  I personally confirmed the key portions of the history and exam documented by Dr. Chikowsky and I reviewed pertinent patient test results.  The assessment, diagnosis, and plan were formulated together and I agree with the documentation in the resident's note. 

## 2013-02-19 ENCOUNTER — Ambulatory Visit: Payer: Medicare Other | Admitting: Internal Medicine

## 2013-03-17 ENCOUNTER — Encounter: Payer: Medicare Other | Admitting: Internal Medicine

## 2013-05-19 ENCOUNTER — Encounter: Payer: Self-pay | Admitting: Internal Medicine

## 2013-05-19 ENCOUNTER — Ambulatory Visit (INDEPENDENT_AMBULATORY_CARE_PROVIDER_SITE_OTHER): Payer: Medicare Other | Admitting: Internal Medicine

## 2013-05-19 VITALS — BP 150/70 | HR 52 | Temp 99.2°F | Ht 68.0 in | Wt 193.8 lb

## 2013-05-19 DIAGNOSIS — E039 Hypothyroidism, unspecified: Secondary | ICD-10-CM | POA: Diagnosis not present

## 2013-05-19 DIAGNOSIS — Z0000 Encounter for general adult medical examination without abnormal findings: Secondary | ICD-10-CM

## 2013-05-19 DIAGNOSIS — I129 Hypertensive chronic kidney disease with stage 1 through stage 4 chronic kidney disease, or unspecified chronic kidney disease: Secondary | ICD-10-CM | POA: Diagnosis not present

## 2013-05-19 DIAGNOSIS — E785 Hyperlipidemia, unspecified: Secondary | ICD-10-CM

## 2013-05-19 DIAGNOSIS — N183 Chronic kidney disease, stage 3 unspecified: Secondary | ICD-10-CM | POA: Diagnosis not present

## 2013-05-19 DIAGNOSIS — Z1231 Encounter for screening mammogram for malignant neoplasm of breast: Secondary | ICD-10-CM

## 2013-05-19 DIAGNOSIS — M109 Gout, unspecified: Secondary | ICD-10-CM | POA: Diagnosis not present

## 2013-05-19 DIAGNOSIS — H409 Unspecified glaucoma: Secondary | ICD-10-CM | POA: Insufficient documentation

## 2013-05-19 DIAGNOSIS — H538 Other visual disturbances: Secondary | ICD-10-CM | POA: Diagnosis not present

## 2013-05-19 DIAGNOSIS — I10 Essential (primary) hypertension: Secondary | ICD-10-CM | POA: Diagnosis not present

## 2013-05-19 MED ORDER — TRIAMTERENE-HCTZ 37.5-25 MG PO TABS: 1.0000 | ORAL_TABLET | Freq: Every day | ORAL | Status: DC

## 2013-05-19 MED ORDER — ATENOLOL 50 MG PO TABS: 50.0000 mg | ORAL_TABLET | Freq: Every day | ORAL | Status: DC

## 2013-05-19 MED ORDER — PRAVASTATIN SODIUM 40 MG PO TABS: 40.0000 mg | ORAL_TABLET | Freq: Every day | ORAL | Status: DC

## 2013-05-19 MED ORDER — TRIAMTERENE 50 MG PO CAPS: 50.0000 mg | ORAL_CAPSULE | Freq: Two times a day (BID) | ORAL | Status: DC

## 2013-05-19 MED ORDER — LEVOTHYROXINE SODIUM 50 MCG PO TABS: 50.0000 ug | ORAL_TABLET | Freq: Every day | ORAL | Status: DC

## 2013-05-19 NOTE — Assessment & Plan Note (Signed)
Of note, patient did receive zostavax at her pharmacy after I gave her the prescription at her last visit.

## 2013-05-19 NOTE — Assessment & Plan Note (Signed)
Order placed for screening mammogram

## 2013-05-19 NOTE — Assessment & Plan Note (Signed)
Pt with presumed gout, though she does not remember if she had a joint aspiration in the past. She has had flares every 2-3 months. She has been on maxide for years. No uric acid level in our system. She is not on allopurinol. Given her convincing history, I do suspect she has gout. However, there has not been a definitive diagnosis made. I will go ahead and stop her maxide given HCTZ is contraindicated in gout patients. We will instead add triamterene alone to her blood pressure regimen. I wanted to check uric acid level today, but pt returning for BMP in 3 weeks so will wait to obtain uric acid until that visit per patient request. May benefit from joint aspiration in the future to confirm this diagnosis.

## 2013-05-19 NOTE — Progress Notes (Signed)
Patient ID: Jill Golden, female   DOB: 01-Jul-1940, 73 y.o.   MRN: [id number] HPI The patient is a 73 y.o. female with a history of HTN, CAD, hypothyroidism, CKD stage 3, HLD, gout (presumed, no known definitive dx) who presents for a routine clinic visit.  Patient is overall doing well without many complaints today. She primarily wants medication refills.   HTN: BP today 150/70. Doing well on maxzide and atenolol.  Gout: Pt with no known prior joint aspiration, but pt unsure if this was done in the past since she has had this dx for so many years. She has gout flares every 2-3 months. She is not on any chronic preventive medication such as allopurinol, though she is on Maxzide, which has HCTZ component and can cause gout flares. She has been on Maxzide for "many years." Most gout flares occur to her R great toe, R ankle and hands.   Overall, patient has become more aware of her health over the last few years. She exercises by walking 2 miles about 4 days per week. She has been cutting out fried foods and eating more fruits and veggies recently. No objective weight loss however.  ROS: General: no fevers, chills, changes in weight Skin: no rash HEENT: +R eye blurry vision; no HA, ST Pulm: no dyspnea, cough CV: no chest pain, shortness of breath either with exertion or at rest Abd: no abdominal pain, nausea/vomiting, diarrhea GU: no dysuria Ext: no arthralgias Neuro: no weakness, numbness, or tingling  Filed Vitals:   05/19/13 1505  BP: 150/70  Pulse: 52  Temp: 99.2 F (37.3 C)  SpO2 99%  Physical Exam General: very pleasant woman; alert, cooperative, and in no apparent distress HEENT: pupils equal round and reactive to light, vision grossly intact, no erythema to conjunctiva, no drainage to eyes, no scleral icterus; oropharynx clear and non-erythematous; upper dentures in place Neck: supple Lungs: CTAB, normal WOB Heart: regular rate and rhythm, no murmurs, gallops, or  rubs Abdomen: soft, non-tender, non-distended, normal bowel sounds Extremities: 2+ DP pulses bilaterally, no pedal edema Neurologic: alert & oriented X3, cranial nerves II-XII grossly intact, strength grossly intact, sensation intact to light touch  Current Outpatient Prescriptions on File Prior to Visit  Medication Sig Dispense Refill  . aspirin 325 MG EC tablet Take 325 mg by mouth daily.      Marland Kitchen. atenolol (TENORMIN) 50 MG tablet Take 1 tablet (50 mg total) by mouth daily.  90 tablet  3  . levothyroxine (SYNTHROID, LEVOTHROID) 50 MCG tablet Take 1 tablet (50 mcg total) by mouth daily before breakfast.  90 tablet  3  . pravastatin (PRAVACHOL) 40 MG tablet Take 1 tablet (40 mg total) by mouth daily.  90 tablet  3  . triamterene-hydrochlorothiazide (MAXZIDE-25) 37.5-25 MG per tablet Take 1 tablet by mouth daily.  90 tablet  3  . vitamin D, CHOLECALCIFEROL, 400 UNITS tablet Take 400 Units by mouth daily.      Marland Kitchen. zoster vaccine live, PF, (ZOSTAVAX) [id number] UNT/0.65ML injection Inject 19,400 Units into the skin once.  1 each  0   No current facility-administered medications on file prior to visit.    Assessment/Plan

## 2013-05-19 NOTE — Assessment & Plan Note (Signed)
BP Readings from Last 3 Encounters:  05/19/13 150/70  12/02/12 152/81  08/13/12 154/78    Lab Results  Component Value Date   NA 140 12/02/2012   K 4.3 12/02/2012   CREATININE 1.32* 12/02/2012    Assessment: Blood pressure control:  good Progress toward BP goal:   at goal Comments: patient on maxide 37.5-25mg  daily and atenolol 50mg  daily  Plan: Medications:   Educational resources provided:   Self management tools provided:   Other plans:  Given patient with presumed gout (no known prior joint aspiration), will need to stop maxide. We will replace this medication with triamterene 50mg  daily. Continue atenolol. She will need to return to clinic in 3 weeks for a BP recheck and BMP to check potassium level.

## 2013-05-19 NOTE — Assessment & Plan Note (Addendum)
No gross abnormality on exam, though given subjective blurriness to R eye over the last month w/ most symptoms with driving at night, suspect cataract. No eye redness, pain, drainage so not likely acute glaucoma or infectious process. She was seen by an "eye doctor" recently, but did not like his approach so is seeking second opinion and is requesting a referral to ophthalmology today. Referral placed.

## 2013-05-19 NOTE — Assessment & Plan Note (Signed)
Pt on synthroid 50mcg daily. Last TSH 4.2 11/2012. No hyper or hypothyroid type symptoms today. Will continue current regimen. Given refill of synthroid today.

## 2013-05-19 NOTE — Patient Instructions (Signed)
Thank you for your visit.  Please stop taking maxide and start taking triamterene 50mg  daily. You will need to come back in 3 weeks to have your blood pressure rechecked. You will also need a BMP and uric acid level checked.  I ordered a mammogram for you today. \  I sent a referral for ophthalmology.

## 2013-05-20 ENCOUNTER — Telehealth: Payer: Self-pay | Admitting: *Deleted

## 2013-05-20 NOTE — Progress Notes (Signed)
Case discussed with Dr. Darci Needlehikowski at the time of the visit.  We reviewed the resident's history and exam and pertinent patient test results.  I agree with the assessment, diagnosis and plan of care documented in the resident's note.    Presumptive diagnosis of gout necessitates discontinuance of HCTZ and evaluation with a uric acid.  If above 6.0 will require initiation of colchicine and allopurinol, titrating the allopurinol to a uric acid level of < 6.0.  Once the uric acid has been below 6.0 for 6 months the colchicine can be discontinued.

## 2013-05-20 NOTE — Telephone Encounter (Signed)
Call from pt - pt stated she noticed Chronic Kidney Disease listed on her AVS sheet yesterday; very concerned. Stated no one had mentioned this to her; should she be concerned?  Told her I will have Dr Darci Needlehikowski call her @ [phone]. Thanks

## 2013-05-22 ENCOUNTER — Telehealth: Payer: Self-pay | Admitting: Internal Medicine

## 2013-05-22 NOTE — Telephone Encounter (Signed)
Given patient's concern regarding her diagnosis of CKD, I spoke with her regarding this issue. It is unclear why she has CKD, though it may be from poorly controlled HTN. She is not a diabetic. It has remained relatively stable over the last few years, though I explained there is always a risk of it progressing. I explained that we will continue to monitor this with regular lab work. She will return to clinic in a couple of weeks and we will obtain lab work at that time. Patient verbalized appreciation for my call and understanding of her problem as it stands now.

## 2013-05-27 NOTE — Telephone Encounter (Signed)
F/U call - pt stated Dr Darci Needlehikowski called her the following day.

## 2013-06-04 DIAGNOSIS — H4011X0 Primary open-angle glaucoma, stage unspecified: Secondary | ICD-10-CM | POA: Diagnosis not present

## 2013-06-04 DIAGNOSIS — H2510 Age-related nuclear cataract, unspecified eye: Secondary | ICD-10-CM | POA: Diagnosis not present

## 2013-06-04 DIAGNOSIS — H02839 Dermatochalasis of unspecified eye, unspecified eyelid: Secondary | ICD-10-CM | POA: Diagnosis not present

## 2013-06-10 ENCOUNTER — Ambulatory Visit: Payer: Medicare Other

## 2013-07-01 DIAGNOSIS — H4011X0 Primary open-angle glaucoma, stage unspecified: Secondary | ICD-10-CM | POA: Diagnosis not present

## 2013-07-08 ENCOUNTER — Ambulatory Visit (INDEPENDENT_AMBULATORY_CARE_PROVIDER_SITE_OTHER): Payer: Medicare Other | Admitting: Internal Medicine

## 2013-07-08 ENCOUNTER — Encounter: Payer: Self-pay | Admitting: Internal Medicine

## 2013-07-08 VITALS — BP 147/87 | HR 65 | Temp 97.5°F | Ht 68.0 in | Wt 192.8 lb

## 2013-07-08 DIAGNOSIS — N183 Chronic kidney disease, stage 3 unspecified: Secondary | ICD-10-CM | POA: Diagnosis not present

## 2013-07-08 DIAGNOSIS — I10 Essential (primary) hypertension: Secondary | ICD-10-CM

## 2013-07-08 DIAGNOSIS — I129 Hypertensive chronic kidney disease with stage 1 through stage 4 chronic kidney disease, or unspecified chronic kidney disease: Secondary | ICD-10-CM

## 2013-07-08 LAB — BASIC METABOLIC PANEL WITH GFR
BUN: 16 mg/dL (ref 6–23)
CHLORIDE: 102 meq/L (ref 96–112)
CO2: 29 mEq/L (ref 19–32)
Calcium: 10 mg/dL (ref 8.4–10.5)
Creat: 1.29 mg/dL — ABNORMAL HIGH (ref 0.50–1.10)
GFR, EST NON AFRICAN AMERICAN: 41 mL/min — AB
GFR, Est African American: 47 mL/min — ABNORMAL LOW
Glucose, Bld: 114 mg/dL — ABNORMAL HIGH (ref 70–99)
POTASSIUM: 4.2 meq/L (ref 3.5–5.3)
SODIUM: 140 meq/L (ref 135–145)

## 2013-07-08 MED ORDER — HYDROCHLOROTHIAZIDE 12.5 MG PO TABS: 12.5000 mg | ORAL_TABLET | Freq: Every day | ORAL | Status: DC

## 2013-07-08 NOTE — Progress Notes (Signed)
Case discussed with Dr. Glenn soon after the resident saw the patient.  We reviewed the resident's history and exam and pertinent patient test results.  I agree with the assessment, diagnosis, and plan of care documented in the resident's note. 

## 2013-07-08 NOTE — Assessment & Plan Note (Signed)
Thought to be related to HTN. Last Cr elevated at 1.32 in Nov. Rechecking BMP today to assess.

## 2013-07-08 NOTE — Progress Notes (Signed)
Patient ID: Jill Golden Horn, female   DOB: [birth year], 73 y.o.   MRN: [id number]  Subjective:   Patient ID: Jill Golden Widmann female   DOB: [birth year] 73 y.o.   MRN: [id number]  HPI: [name] is a 73 y.o. F w/ PMH HTN, CAD, hypothyroidism, CKD stage 3, HLD, gout presents for flu for her CKD to labs.  She was seen on 05/19/13 and was felt to be doing well. A BMP was last checked 11/2012 which showed increased of her Cr to 1.32 from 1.15 08/2011. Her Cr has been up to 1.33 07/2011. Her CKD is thought to be due to her HTN.   Her BP was slightly elevated at her last visit, and the Maxzide was d/c'd and she was started on Triamterene. She was to continue the Atenolol 50mg  daily. She endorses compliance with her medications.  Overall the pt is doing well today and has no complaints.   She declines to have a mammogram, and states that she would not want to have any intervention if she did have cancer or something suspicious. She does have a family h/o breast cancer, and states that her sister died from this.    Past Medical History  Diagnosis Date  . Diabetes mellitus   . Hypertension   . Dizziness   . Anxiety   . BPPV (benign paroxysmal positional vertigo)   . CAD (coronary artery disease)     4 stents placed prior to CABAG (1996 ? ) , s/p CABG 1996   . Diabetes mellitus, type 2 05/09/2010  . Hypothyroid    Current Outpatient Prescriptions  Medication Sig Dispense Refill  . aspirin 325 MG EC tablet Take 325 mg by mouth daily.      Marland Kitchen. atenolol (TENORMIN) 50 MG tablet Take 1 tablet (50 mg total) by mouth daily.  90 tablet  3  . levothyroxine (SYNTHROID, LEVOTHROID) 50 MCG tablet Take 1 tablet (50 mcg total) by mouth daily before breakfast.  90 tablet  3  . pravastatin (PRAVACHOL) 40 MG tablet Take 1 tablet (40 mg total) by mouth daily.  90 tablet  3  . triamterene (DYRENIUM) 50 MG capsule Take 1 capsule (50 mg total) by mouth 2 (two) times daily.  30 capsule  1  . vitamin D,  CHOLECALCIFEROL, 400 UNITS tablet Take 400 Units by mouth daily.       No current facility-administered medications for this visit.   No family history on file. History   Social History  . Marital Status: Widowed    Spouse Name: N/A    Number of Children: N/A  . Years of Education: N/A   Social History Main Topics  . Smoking status: Former Smoker    Quit date: 05/09/1994  . Smokeless tobacco: Not on file  . Alcohol Use: No  . Drug Use: No  . Sexual Activity: Not on file   Other Topics Concern  . Not on file   Social History Narrative  . No narrative on file   Review of Systems: Constitutional: Denies fever, chills, or fatigue.  HEENT: +pressure behind the eyes, h/o glaucoma, just saw opthomologist Respiratory: Denies SOB, DOE, cough.   Cardiovascular: Denies chest pain, palpitations and leg swelling.  Gastrointestinal: Denies nausea, vomiting, abdominal pain, diarrhea, constipation Genitourinary: Denies dysuria, urgency, frequency Musculoskeletal: Denies myalgias, back pain, joint swelling, arthralgias and gait problem.  Skin: Denies rash and wound.  Neurological: Denies dizziness, weakness, light-headedness, or numbness.  Psychiatric/Behavioral: Denies suicidal ideation, mood changes  Objective:  Physical Exam: Filed Vitals:   07/08/13 1006  BP: 152/83  Pulse: 60  Temp: 97.5 F (36.4 C)  TempSrc: Oral  Weight: 192 lb 12.8 oz (87.454 kg)  SpO2: 97%   Constitutional: Vital signs reviewed.  Patient is a well-developed and well-nourished female in no acute distress and cooperative with exam.  Head: Normocephalic and atraumatic Eyes: PERRL, EOMI  Cardiovascular: RRR, no MRG, pulses symmetric and intact bilaterally Pulmonary/Chest: Normal respiratory effort, CTAB, no wheezes, rales, or rhonchi Abdominal: Soft. Non-tender, non-distended, bowel sounds are normal, no masses, organomegaly, or guarding present.  Musculoskeletal: No joint deformities, erythema, moves all  4 extremities.  Neurological: A&O x3, Strength is normal and symmetric bilaterally, cranial nerve II-XII are grossly intact, no focal motor deficit, sensory intact to light touch bilaterally.  Skin: Warm, dry and intact.  Psychiatric: Normal mood and affect. Speech and behavior is normal.   Assessment & Plan:   Please refer to Problem List based Assessment and Plan

## 2013-07-08 NOTE — Assessment & Plan Note (Addendum)
BP Readings from Last 3 Encounters:  07/08/13 147/87  05/19/13 150/70  12/02/12 152/81    Lab Results  Component Value Date   NA 140 12/02/2012   K 4.3 12/02/2012   CREATININE 1.32* 12/02/2012    Assessment: Blood pressure control: mildly elevated Progress toward BP goal:  improved Comments: BP slightly improved after stopping the Maxzide and increasing the triamterene.   Plan: Medications:  continue current medications, and will restart HCTZ but at 12.5mg  po daily Educational resources provided: brochure Self management tools provided: home blood pressure logbook Other plans: Checking BMP to assess potassium and renal function. - F/u in 1 month for BP recheck.

## 2013-07-08 NOTE — Patient Instructions (Addendum)
**  We will add back the HCTZ today but at a lower dose of 12.5mg  daily. Continue to take the triamterene and Atenolol.   **We are checking your electrolytes and kidney function today.   **Plear return to the clinic in 1 month for a blood pressure recheck and lab recheck.   General Instructions:   Please bring your medicines with you each time you come to clinic.  Medicines may include prescription medications, over-the-counter medications, herbal remedies, eye drops, vitamins, or other pills.   Progress Toward Treatment Goals:  Treatment Goal 07/08/2013  Blood pressure improved  Prevent falls -    Self Care Goals & Plans:  Self Care Goal 07/08/2013  Manage my medications take my medicines as prescribed; bring my medications to every visit; refill my medications on time  Monitor my health keep track of my blood pressure  Eat healthy foods eat foods that are low in salt; eat more vegetables; eat baked foods instead of fried foods; eat smaller portions  Be physically active take a walk every day    No flowsheet data found.   Care Management & Community Referrals:  No flowsheet data found.

## 2013-07-15 DIAGNOSIS — H2589 Other age-related cataract: Secondary | ICD-10-CM | POA: Diagnosis not present

## 2013-07-15 DIAGNOSIS — H4011X0 Primary open-angle glaucoma, stage unspecified: Secondary | ICD-10-CM | POA: Diagnosis not present

## 2013-08-05 ENCOUNTER — Encounter: Payer: Medicare Other | Admitting: Internal Medicine

## 2013-08-05 DIAGNOSIS — H4011X0 Primary open-angle glaucoma, stage unspecified: Secondary | ICD-10-CM | POA: Diagnosis not present

## 2013-08-05 DIAGNOSIS — H2589 Other age-related cataract: Secondary | ICD-10-CM | POA: Diagnosis not present

## 2013-08-05 DIAGNOSIS — H04129 Dry eye syndrome of unspecified lacrimal gland: Secondary | ICD-10-CM | POA: Diagnosis not present

## 2013-08-15 ENCOUNTER — Ambulatory Visit (INDEPENDENT_AMBULATORY_CARE_PROVIDER_SITE_OTHER): Payer: Medicare Other | Admitting: Internal Medicine

## 2013-08-15 ENCOUNTER — Encounter: Payer: Self-pay | Admitting: Internal Medicine

## 2013-08-15 VITALS — BP 130/70 | HR 50 | Temp 97.8°F | Ht 68.0 in | Wt 192.7 lb

## 2013-08-15 DIAGNOSIS — R7309 Other abnormal glucose: Secondary | ICD-10-CM | POA: Diagnosis not present

## 2013-08-15 DIAGNOSIS — I10 Essential (primary) hypertension: Secondary | ICD-10-CM | POA: Diagnosis not present

## 2013-08-15 DIAGNOSIS — H409 Unspecified glaucoma: Secondary | ICD-10-CM

## 2013-08-15 DIAGNOSIS — G4700 Insomnia, unspecified: Secondary | ICD-10-CM

## 2013-08-15 DIAGNOSIS — R7303 Prediabetes: Secondary | ICD-10-CM

## 2013-08-15 LAB — POCT GLYCOSYLATED HEMOGLOBIN (HGB A1C): HEMOGLOBIN A1C: 5.9

## 2013-08-15 LAB — GLUCOSE, CAPILLARY: GLUCOSE-CAPILLARY: 94 mg/dL (ref 70–99)

## 2013-08-15 NOTE — Assessment & Plan Note (Signed)
Assessment She normally sleeps for 5 hours and doesn't feel tired during the day. She tries not to nap during the day. She feels having the TV is on as it's been a habit. She exerts herself every day by walking 2 miles. She denies feeling tired during the day.  Plan -No indication to start medication given that she denies feeling tired or being low on energy -Continue assessing at each visit

## 2013-08-15 NOTE — Patient Instructions (Addendum)
Please make sure you are keeping up your fluid intake with the heat and the walking.   Keep up the good work!

## 2013-08-15 NOTE — Assessment & Plan Note (Signed)
BP Readings from Last 3 Encounters:  08/15/13 130/70  07/08/13 147/87  05/19/13 150/70    Lab Results  Component Value Date   NA 140 07/08/2013   K 4.2 07/08/2013   CREATININE 1.29* 07/08/2013    Assessment: Blood pressure control: controlled Progress toward BP goal:  at goal Comments: Her BP today is 130/50. She feels her medicine regimen is fine as she has been on it for 20+ years. She takes her blood pressure every day.   Plan: Medications:  atenonol 50mg , maxide (25mg /37.5mg ) Educational resources provided:   Product managerelf management tools provided:   Other plans:  -Continue encouraging healthy habits

## 2013-08-15 NOTE — Progress Notes (Signed)
   Subjective:    Patient ID: Lesli Albeevy Louise Ireland, female    DOB: [birth year], 73 y.o.   MRN: [id number]  HPI [name] is a 73 year old female with HTN, CAD, hypothyroidism, CKD stage 3, HLD, gout.   She was last seen in clinic on 6/30 by Dr. Sherrine MaplesGlenn. -Stable from last visit; continue reassessing  Review of Systems  Constitutional: Negative for appetite change and fatigue.  Respiratory: Negative for shortness of breath.   Cardiovascular: Negative for chest pain and leg swelling.  Gastrointestinal: Negative for abdominal pain.  Neurological: Negative for dizziness, syncope and weakness.  Psychiatric/Behavioral: Negative for suicidal ideas.       Objective:   Physical Exam  General: resting in bed, NAD HEENT: PERRL, EOMI, no scleral icterus Cardiac: RRR, no rubs, murmurs or gallops Pulm: clear to auscultation bilaterally, no wheezes, rales, or rhonchi Abd: soft, nontender, nondistended, BS present Ext: warm and well perfused, no pedal edema Neuro: alert and oriented X3, cranial nerves II-XII grossly intact, strength and sensation to light touch equal in bilateral upper and lower extremities      Assessment & Plan:

## 2013-08-15 NOTE — Assessment & Plan Note (Signed)
Assessment She was on metformin at one point with A1c 6. She continues to walk and eat better.  Plan -Recheck A1c today--->5.9 -Continue encouraging her to keep up her good health

## 2013-08-15 NOTE — Assessment & Plan Note (Signed)
Assessment She has not had any flares since May and is doing well.   Plan -Stable from last visit; continue reassessing

## 2013-08-15 NOTE — Assessment & Plan Note (Signed)
Assessment She sees Dr. Dione BoozeGroat for glaucoma and takes latanoprost and timolol eye drops in both eyes.  Plan -Stable; continue reassessing

## 2013-08-18 NOTE — Progress Notes (Signed)
Attending physician note: I personally interviewed and examined this patient together with resident physician Dr. Heywood Ilesushil Patel on the day of the patient's visit and I concur with his assessment and management plan. Cephas DarbyJames Jaysean Manville, M.D., FACP

## 2013-10-07 ENCOUNTER — Ambulatory Visit: Payer: Medicare Other | Admitting: Internal Medicine

## 2013-12-17 ENCOUNTER — Encounter: Payer: Self-pay | Admitting: *Deleted

## 2014-02-12 ENCOUNTER — Encounter: Payer: Self-pay | Admitting: Internal Medicine

## 2014-02-12 ENCOUNTER — Encounter: Payer: Medicare Other | Admitting: Internal Medicine

## 2014-02-13 ENCOUNTER — Encounter: Payer: Medicare Other | Admitting: Internal Medicine

## 2014-02-13 ENCOUNTER — Other Ambulatory Visit: Payer: Self-pay | Admitting: *Deleted

## 2014-02-13 DIAGNOSIS — E038 Other specified hypothyroidism: Secondary | ICD-10-CM

## 2014-02-13 MED ORDER — LEVOTHYROXINE SODIUM 50 MCG PO TABS: 50.0000 ug | ORAL_TABLET | Freq: Every day | ORAL | Status: DC

## 2014-02-13 NOTE — Telephone Encounter (Signed)
As this patient was seen by me on 08/15/2013 and deemed necessary for their treatment as documented in the EHR, I will refill this prescription for Synthroid

## 2014-02-18 DIAGNOSIS — H25813 Combined forms of age-related cataract, bilateral: Secondary | ICD-10-CM | POA: Diagnosis not present

## 2014-02-18 DIAGNOSIS — H4011X3 Primary open-angle glaucoma, severe stage: Secondary | ICD-10-CM | POA: Diagnosis not present

## 2014-02-18 DIAGNOSIS — H10412 Chronic giant papillary conjunctivitis, left eye: Secondary | ICD-10-CM | POA: Diagnosis not present

## 2014-02-18 DIAGNOSIS — H04123 Dry eye syndrome of bilateral lacrimal glands: Secondary | ICD-10-CM | POA: Diagnosis not present

## 2014-05-22 ENCOUNTER — Encounter: Payer: Self-pay | Admitting: Internal Medicine

## 2014-05-22 ENCOUNTER — Ambulatory Visit (INDEPENDENT_AMBULATORY_CARE_PROVIDER_SITE_OTHER): Payer: Medicare Other | Admitting: Internal Medicine

## 2014-05-22 VITALS — BP 140/76 | HR 56 | Temp 98.3°F | Ht 68.0 in | Wt 189.7 lb

## 2014-05-22 DIAGNOSIS — I129 Hypertensive chronic kidney disease with stage 1 through stage 4 chronic kidney disease, or unspecified chronic kidney disease: Secondary | ICD-10-CM | POA: Diagnosis not present

## 2014-05-22 DIAGNOSIS — Z23 Encounter for immunization: Secondary | ICD-10-CM

## 2014-05-22 DIAGNOSIS — Z13820 Encounter for screening for osteoporosis: Secondary | ICD-10-CM | POA: Diagnosis not present

## 2014-05-22 DIAGNOSIS — E038 Other specified hypothyroidism: Secondary | ICD-10-CM | POA: Diagnosis not present

## 2014-05-22 DIAGNOSIS — E559 Vitamin D deficiency, unspecified: Secondary | ICD-10-CM

## 2014-05-22 DIAGNOSIS — I10 Essential (primary) hypertension: Secondary | ICD-10-CM

## 2014-05-22 DIAGNOSIS — R7309 Other abnormal glucose: Secondary | ICD-10-CM | POA: Diagnosis not present

## 2014-05-22 DIAGNOSIS — N183 Chronic kidney disease, stage 3 unspecified: Secondary | ICD-10-CM

## 2014-05-22 DIAGNOSIS — E039 Hypothyroidism, unspecified: Secondary | ICD-10-CM | POA: Diagnosis not present

## 2014-05-22 DIAGNOSIS — Z7189 Other specified counseling: Secondary | ICD-10-CM | POA: Insufficient documentation

## 2014-05-22 DIAGNOSIS — R7303 Prediabetes: Secondary | ICD-10-CM

## 2014-05-22 DIAGNOSIS — Z780 Asymptomatic menopausal state: Secondary | ICD-10-CM

## 2014-05-22 DIAGNOSIS — Z7982 Long term (current) use of aspirin: Secondary | ICD-10-CM

## 2014-05-22 LAB — COMPLETE METABOLIC PANEL WITH GFR
ALK PHOS: 57 U/L (ref 39–117)
ALT: 13 U/L (ref 0–35)
AST: 20 U/L (ref 0–37)
Albumin: 4.2 g/dL (ref 3.5–5.2)
BUN: 17 mg/dL (ref 6–23)
CHLORIDE: 103 meq/L (ref 96–112)
CO2: 26 meq/L (ref 19–32)
Calcium: 9.7 mg/dL (ref 8.4–10.5)
Creat: 1.2 mg/dL — ABNORMAL HIGH (ref 0.50–1.10)
GFR, Est African American: 51 mL/min — ABNORMAL LOW
GFR, Est Non African American: 44 mL/min — ABNORMAL LOW
GLUCOSE: 88 mg/dL (ref 70–99)
POTASSIUM: 4 meq/L (ref 3.5–5.3)
Sodium: 142 mEq/L (ref 135–145)
Total Bilirubin: 0.5 mg/dL (ref 0.2–1.2)
Total Protein: 7.5 g/dL (ref 6.0–8.3)

## 2014-05-22 LAB — POCT GLYCOSYLATED HEMOGLOBIN (HGB A1C): Hemoglobin A1C: 5.8

## 2014-05-22 LAB — TSH: TSH: 2.117 u[IU]/mL (ref 0.350–4.500)

## 2014-05-22 LAB — GLUCOSE, CAPILLARY: GLUCOSE-CAPILLARY: 105 mg/dL — AB (ref 65–99)

## 2014-05-22 MED ORDER — ATENOLOL 50 MG PO TABS: 50.0000 mg | ORAL_TABLET | Freq: Every day | ORAL | Status: DC

## 2014-05-22 MED ORDER — ASPIRIN 325 MG PO TBEC: 325.0000 mg | DELAYED_RELEASE_TABLET | Freq: Every day | ORAL | Status: DC

## 2014-05-22 MED ORDER — TRIAMTERENE-HCTZ 37.5-25 MG PO TABS: 1.0000 | ORAL_TABLET | Freq: Every day | ORAL | Status: DC

## 2014-05-22 MED ORDER — LEVOTHYROXINE SODIUM 50 MCG PO TABS: 50.0000 ug | ORAL_TABLET | Freq: Every day | ORAL | Status: DC

## 2014-05-22 NOTE — Progress Notes (Signed)
   Subjective:    Patient ID: Lesli Albeevy Louise Schuenemann, female    DOB: July 11, 1940, 74 y.o.   MRN: [id number]  HPI [name] is a 74 year old female with HTN, CAD, hypothyroidism, CKD stage 3, HLD, history of gout who presented for follow-up visit.  Please see assessment & plan for documentation of each problem.   Review of Systems  Respiratory: Negative for shortness of breath.   Cardiovascular: Negative for chest pain and leg swelling.  Gastrointestinal: Negative for nausea, vomiting, abdominal pain, diarrhea and blood in stool.  Genitourinary: Negative for dysuria.  Neurological: Negative for dizziness.       Objective:   Physical Exam Constitutional: Overweight elderly African-American woman. She is oriented to person, place, and time. She appears well-developed and well-nourished. No distress.  HENT:  Head: Normocephalic and atraumatic.  Eyes: Conjunctivae are normal. Pupils are equal, round, and reactive to light.  Cardiovascular: Normal rate, regular rhythm and normal heart sounds.  Exam reveals no gallop and no friction rub.   No murmur heard. Pulmonary/Chest: Effort normal. No respiratory distress. She has no wheezes. She has no rales.  Abdominal: Soft. Bowel sounds are normal. She exhibits no distension. There is no tenderness.  Neurological: She is alert and oriented to person, place, and time. No cranial nerve deficit. Coordination normal.  Skin: Skin is warm and dry. She is not diaphoretic.  Psychiatric: Her behavior is normal.         Assessment & Plan:

## 2014-05-22 NOTE — Assessment & Plan Note (Signed)
BP Readings from Last 3 Encounters:  05/22/14 140/76  08/15/13 130/70  07/08/13 147/87    Lab Results  Component Value Date   NA 140 07/08/2013   K 4.2 07/08/2013   CREATININE 1.29* 07/08/2013    Assessment: Blood pressure control: controlled Progress toward BP goal:  at goal Comments: Blood pressure is at goal.  Plan: Medications:  Atenolol 50 mg, triamterene-HCTZ 37.5-25 milligrams Educational resources provided:   Self management tools provided:   Other plans: Refilled her medications today

## 2014-05-22 NOTE — Assessment & Plan Note (Signed)
Recheck CMET today to reassess her kidney function.

## 2014-05-22 NOTE — Assessment & Plan Note (Addendum)
Given her independence and functional status, we discussed the risks and benefits of obtaining a DEXA scan, she agreed to having it scheduled. We'll also plan to check vitamin D level as she has been on supplementation for some time and can titrate accordingly. She denies any recent falls and continues to walk daily.  ADDENDUM 05/26/2014  5:23 PM:  Vitamin D noted to be 19, so I will go and a prescribe her ergocalciferol 800 units daily with plan to recheck in 3 months.

## 2014-05-22 NOTE — Assessment & Plan Note (Signed)
We deferred immunizations today to have it coadministered with flu vaccine in September.

## 2014-05-22 NOTE — Assessment & Plan Note (Signed)
We spoke at length about her mother's end-of-life care, and she reported to me that she has documented her wishes should she ever been a state where she cannot advocate or express the decisions she would like to have for herself. She reports to me that she is DNR/DNI status though did not clarify whether she would want these wishes upheld should her condition be reversible. She will bring in her paperwork so that we may review it comprehensively to have it incorporated as a part of her electronic chart.

## 2014-05-22 NOTE — Patient Instructions (Signed)
General Instructions:   Please bring your medicines with you each time you come to clinic.  Medicines may include prescription medications, over-the-counter medications, herbal remedies, eye drops, vitamins, or other pills.  Please keep up the good work and follow-up in September for flu/pneumonia shot.   Progress Toward Treatment Goals:  Treatment Goal 08/15/2013  Blood pressure at goal  Prevent falls -    Self Care Goals & Plans:  Self Care Goal 07/08/2013  Manage my medications take my medicines as prescribed; bring my medications to every visit; refill my medications on time  Monitor my health keep track of my blood pressure  Eat healthy foods eat foods that are low in salt; eat more vegetables; eat baked foods instead of fried foods; eat smaller portions  Be physically active take a walk every day    No flowsheet data found.   Care Management & Community Referrals:  No flowsheet data found.

## 2014-05-22 NOTE — Assessment & Plan Note (Signed)
A1c rechecked today

## 2014-05-22 NOTE — Assessment & Plan Note (Signed)
Plan to recheck TSH today as she has not had it checked at least one year.

## 2014-05-22 NOTE — Assessment & Plan Note (Deleted)
We spoke at length about her mother's end-of-life care, and she reported to me that she has documented her wishes should she ever been a state where she cannot advocate or express the decisions she would like to have for herself. She reports to me that she is DNR/DNI status though do not clarify whether she would want these wishes upheld should her condition be reversible. She will bring in her paperwork so that we may review it comprehensively to have it incorporated as a part of her electronic chart.

## 2014-05-23 LAB — VITAMIN D 25 HYDROXY (VIT D DEFICIENCY, FRACTURES): Vit D, 25-Hydroxy: 17 ng/mL — ABNORMAL LOW (ref 30–100)

## 2014-05-23 NOTE — Progress Notes (Signed)
Internal Medicine Clinic Attending  Case discussed with Dr. Patel at the time of the visit.  We reviewed the resident's history and exam and pertinent patient test results.  I agree with the assessment, diagnosis, and plan of care documented in the resident's note.  

## 2014-05-25 ENCOUNTER — Other Ambulatory Visit: Payer: Self-pay | Admitting: *Deleted

## 2014-05-25 DIAGNOSIS — I10 Essential (primary) hypertension: Secondary | ICD-10-CM

## 2014-05-25 DIAGNOSIS — E785 Hyperlipidemia, unspecified: Secondary | ICD-10-CM

## 2014-05-26 ENCOUNTER — Encounter (HOSPITAL_COMMUNITY): Payer: Self-pay | Admitting: *Deleted

## 2014-05-26 ENCOUNTER — Emergency Department (HOSPITAL_COMMUNITY): Payer: Medicare Other

## 2014-05-26 ENCOUNTER — Emergency Department (HOSPITAL_COMMUNITY)
Admission: EM | Admit: 2014-05-26 | Discharge: 2014-05-26 | Disposition: A | Discharge status: Home / Self Care | Payer: Medicare Other | Attending: Emergency Medicine | Admitting: Emergency Medicine

## 2014-05-26 DIAGNOSIS — I2510 Atherosclerotic heart disease of native coronary artery without angina pectoris: Secondary | ICD-10-CM | POA: Diagnosis not present

## 2014-05-26 DIAGNOSIS — Z79899 Other long term (current) drug therapy: Secondary | ICD-10-CM | POA: Insufficient documentation

## 2014-05-26 DIAGNOSIS — E039 Hypothyroidism, unspecified: Secondary | ICD-10-CM | POA: Insufficient documentation

## 2014-05-26 DIAGNOSIS — Z8659 Personal history of other mental and behavioral disorders: Secondary | ICD-10-CM | POA: Insufficient documentation

## 2014-05-26 DIAGNOSIS — Z880 Allergy status to penicillin: Secondary | ICD-10-CM | POA: Diagnosis not present

## 2014-05-26 DIAGNOSIS — Z87891 Personal history of nicotine dependence: Secondary | ICD-10-CM | POA: Insufficient documentation

## 2014-05-26 DIAGNOSIS — Z7982 Long term (current) use of aspirin: Secondary | ICD-10-CM | POA: Insufficient documentation

## 2014-05-26 DIAGNOSIS — I10 Essential (primary) hypertension: Secondary | ICD-10-CM | POA: Insufficient documentation

## 2014-05-26 DIAGNOSIS — R51 Headache: Secondary | ICD-10-CM | POA: Diagnosis not present

## 2014-05-26 DIAGNOSIS — E119 Type 2 diabetes mellitus without complications: Secondary | ICD-10-CM | POA: Diagnosis not present

## 2014-05-26 DIAGNOSIS — Z951 Presence of aortocoronary bypass graft: Secondary | ICD-10-CM | POA: Insufficient documentation

## 2014-05-26 DIAGNOSIS — N289 Disorder of kidney and ureter, unspecified: Secondary | ICD-10-CM | POA: Diagnosis not present

## 2014-05-26 DIAGNOSIS — H81399 Other peripheral vertigo, unspecified ear: Secondary | ICD-10-CM | POA: Insufficient documentation

## 2014-05-26 DIAGNOSIS — R42 Dizziness and giddiness: Secondary | ICD-10-CM | POA: Diagnosis present

## 2014-05-26 DIAGNOSIS — R110 Nausea: Secondary | ICD-10-CM | POA: Diagnosis not present

## 2014-05-26 LAB — BASIC METABOLIC PANEL
Anion gap: 10 (ref 5–15)
BUN: 17 mg/dL (ref 6–20)
CO2: 25 mmol/L (ref 22–32)
CREATININE: 1.3 mg/dL — AB (ref 0.44–1.00)
Calcium: 9.4 mg/dL (ref 8.9–10.3)
Chloride: 103 mmol/L (ref 101–111)
GFR calc Af Amer: 45 mL/min — ABNORMAL LOW (ref >60)
GFR calc non Af Amer: 39 mL/min — ABNORMAL LOW (ref >60)
GLUCOSE: 166 mg/dL — AB (ref 65–99)
POTASSIUM: 3.8 mmol/L (ref 3.5–5.1)
Sodium: 138 mmol/L (ref 135–145)

## 2014-05-26 LAB — DIFFERENTIAL
BASOS PCT: 0 % (ref 0–1)
Basophils Absolute: 0 10*3/uL (ref 0.0–0.1)
EOS ABS: 0.2 10*3/uL (ref 0.0–0.7)
Eosinophils Relative: 2 % (ref 0–5)
LYMPHS ABS: 4.4 10*3/uL — AB (ref 0.7–4.0)
Lymphocytes Relative: 33 % (ref 12–46)
MONOS PCT: 6 % (ref 3–12)
Monocytes Absolute: 0.8 10*3/uL (ref 0.1–1.0)
Neutro Abs: 8 10*3/uL — ABNORMAL HIGH (ref 1.7–7.7)
Neutrophils Relative %: 59 % (ref 43–77)

## 2014-05-26 LAB — CBC
HCT: 40.4 % (ref 36.0–46.0)
Hemoglobin: 13.2 g/dL (ref 12.0–15.0)
MCH: 27.3 pg (ref 26.0–34.0)
MCHC: 32.7 g/dL (ref 30.0–36.0)
MCV: 83.5 fL (ref 78.0–100.0)
Platelets: 219 10*3/uL (ref 150–400)
RBC: 4.84 MIL/uL (ref 3.87–5.11)
RDW: 15.1 % (ref 11.5–15.5)
WBC: 13.4 10*3/uL — ABNORMAL HIGH (ref 4.0–10.5)

## 2014-05-26 LAB — I-STAT TROPONIN, ED: Troponin i, poc: 0 ng/mL (ref 0.00–0.08)

## 2014-05-26 MED ORDER — MECLIZINE HCL 50 MG PO TABS: 50.0000 mg | ORAL_TABLET | Freq: Three times a day (TID) | ORAL | Status: DC | PRN

## 2014-05-26 MED ORDER — ONDANSETRON HCL 4 MG/2ML IJ SOLN
4.0000 mg | Freq: Once | INTRAMUSCULAR | Status: AC
  Administered 2014-05-26: 4 mg via INTRAVENOUS
  Filled 2014-05-26: qty 2

## 2014-05-26 MED ORDER — ERGOCALCIFEROL 10 MCG (400 UNIT) PO TABS: 800.0000 [IU] | ORAL_TABLET | Freq: Every day | ORAL | Status: DC

## 2014-05-26 MED ORDER — PRAVASTATIN SODIUM 40 MG PO TABS: 40.0000 mg | ORAL_TABLET | Freq: Every day | ORAL | Status: DC

## 2014-05-26 MED ORDER — DIPHENHYDRAMINE HCL 50 MG/ML IJ SOLN
12.5000 mg | Freq: Once | INTRAMUSCULAR | Status: AC
  Administered 2014-05-26: 12.5 mg via INTRAVENOUS
  Filled 2014-05-26: qty 1

## 2014-05-26 MED ORDER — METOCLOPRAMIDE HCL 5 MG/ML IJ SOLN
5.0000 mg | Freq: Once | INTRAMUSCULAR | Status: AC
  Administered 2014-05-26: 5 mg via INTRAVENOUS
  Filled 2014-05-26: qty 2

## 2014-05-26 MED ORDER — LORAZEPAM 1 MG PO TABS
1.0000 mg | ORAL_TABLET | Freq: Once | ORAL | Status: AC
  Administered 2014-05-26: 1 mg via ORAL
  Filled 2014-05-26: qty 1

## 2014-05-26 MED ORDER — MECLIZINE HCL 25 MG PO TABS
25.0000 mg | ORAL_TABLET | Freq: Once | ORAL | Status: AC
  Administered 2014-05-26: 25 mg via ORAL
  Filled 2014-05-26: qty 1

## 2014-05-26 MED ORDER — SODIUM CHLORIDE 0.9 % IV BOLUS (SEPSIS)
500.0000 mL | Freq: Once | INTRAVENOUS | Status: AC
  Administered 2014-05-26: 500 mL via INTRAVENOUS

## 2014-05-26 MED ORDER — ACETAMINOPHEN 325 MG PO TABS
650.0000 mg | ORAL_TABLET | Freq: Once | ORAL | Status: AC
  Administered 2014-05-26: 650 mg via ORAL
  Filled 2014-05-26: qty 2

## 2014-05-26 NOTE — Telephone Encounter (Signed)
As this patient was seen by me on 05/22/14 and deemed necessary for their treatment as documented in the EHR, I will refill this prescription for pravastatin.

## 2014-05-26 NOTE — ED Provider Notes (Signed)
1:29 PM  The patient continues to appear on exam. She did develop a gradual onset headache while here and saw decided to go ahead and get a CT scan of her head. She has had only mild relief with treatments here. I do suspect her symptoms to be related to peripheral vertigo and not due to cerebellar stroke. She was able to ambulate with assistance. I offered admission versus discharge home with continued rest. She would prefer to go home. I think this is reasonable. We'll prescribe meclizine although I warned her that it is to be used when necessary and can be sedating leading to falls. We'll recommend close follow-up with her primary care doctor or return here for any worsening.  Clinical Impression 1. Peripheral vertigo, unspecified laterality   2. Renal insufficiency      Purvis SheffieldForrest Jill Koval, MD 05/26/14 1330

## 2014-05-26 NOTE — ED Provider Notes (Signed)
CSN: [id number]     Arrival date & time 05/26/14  0539 History   First MD Initiated Contact with Patient 05/26/14 [phone]     Chief Complaint  Patient presents with  . Dizziness     (Consider location/radiation/quality/duration/timing/severity/associated sxs/prior Treatment) Patient is a 74 y.o. female presenting with dizziness. The history is provided by the patient.  Dizziness She woke up at 4 AM with severe dizziness which she describes as everything moving her dizziness is worse with any movement and better she closes her eyes. There is associated nausea and vomiting and she is also noted some humming in her ears. She denies any ear pain. Symptoms are similar to an episode where she had been admitted to the hospital about 3 years ago.  Past Medical History  Diagnosis Date  . Diabetes mellitus   . Hypertension   . Dizziness   . Anxiety   . BPPV (benign paroxysmal positional vertigo)   . CAD (coronary artery disease)     4 stents placed prior to CABAG (1996 ? ) , s/p CABG 1996   . Diabetes mellitus, type 2 05/09/2010  . Hypothyroid    Past Surgical History  Procedure Laterality Date  . Coronary artery bypass graft    . Abdominal hysterectomy    . Tubal ligation    . Cholecystectomy    . Appendectomy     History reviewed. No pertinent family history. History  Substance Use Topics  . Smoking status: Former Smoker    Quit date: 05/09/1994  . Smokeless tobacco: Never Used  . Alcohol Use: No   OB History    No data available     Review of Systems  Neurological: Positive for dizziness.  All other systems reviewed and are negative.     Allergies  Lisinopril and Penicillins  Home Medications   Prior to Admission medications   Medication Sig Start Date End Date Taking? Authorizing Provider  aspirin 325 MG EC tablet Take 1 tablet (325 mg total) by mouth daily. 05/22/14   Rushil Terrilee CroakPatel V, MD  atenolol (TENORMIN) 50 MG tablet Take 1 tablet (50 mg total) by mouth daily.  05/22/14   Rushil Terrilee CroakPatel V, MD  Dorzolamide HCl-Timolol Mal PF 22.3-6.8 MG/ML SOLN Apply to eye.    Historical Provider, MD  latanoprost (XALATAN) 0.005 % ophthalmic solution Place 1 drop into both eyes at bedtime.    Historical Provider, MD  levothyroxine (SYNTHROID, LEVOTHROID) 50 MCG tablet Take 1 tablet (50 mcg total) by mouth daily before breakfast. 05/22/14   Rushil Terrilee CroakPatel V, MD  pravastatin (PRAVACHOL) 40 MG tablet Take 1 tablet (40 mg total) by mouth daily. 05/19/13 05/19/14  Coolidge Breezeachel E Chikowski, MD  triamterene-hydrochlorothiazide (MAXZIDE-25) 37.5-25 MG per tablet Take 1 tablet by mouth daily. 05/22/14   Rushil Terrilee CroakPatel V, MD  vitamin D, CHOLECALCIFEROL, 400 UNITS tablet Take 400 Units by mouth daily.    Historical Provider, MD   BP 176/71 mmHg  Pulse 58  Temp(Src) 97.6 F (36.4 C) (Oral)  Resp 14  Ht 5\' 8"  (1.727 m)  Wt 187 lb (84.823 kg)  BMI 28.44 kg/m2  SpO2 98%  LMP 05/09/1982 Physical Exam  Nursing note and vitals reviewed.  74 year old female, resting comfortably and in no acute distress. Vital signs are significant for hypertension and borderline bradycardia. Oxygen saturation is 98%, which is normal. Head is normocephalic and atraumatic. PERRLA, EOMI. Oropharynx is clear. TMs are clear. Neck is nontender and supple without adenopathy or JVD. Back is  nontender and there is no CVA tenderness. Lungs are clear without rales, wheezes, or rhonchi. Chest is nontender. Heart has regular rate and rhythm without murmur. Abdomen is soft, flat, nontender without masses or hepatosplenomegaly and peristalsis is normoactive. Extremities have no cyanosis or edema, full range of motion is present. Skin is warm and dry without rash. Neurologic: Mental status is normal, cranial nerves are intact, there are no motor or sensory deficits. Dizziness is reproduced by passive head movement.  ED Course  Procedures (including critical care time) Labs Review Results for orders placed or performed  during the hospital encounter of 05/26/14  CBC  Result Value Ref Range   WBC 13.4 (H) 4.0 - 10.5 K/uL   RBC 4.84 3.87 - 5.11 MIL/uL   Hemoglobin 13.2 12.0 - 15.0 g/dL   HCT 16.1 09.6 - 04.5 %   MCV 83.5 78.0 - 100.0 fL   MCH 27.3 26.0 - 34.0 pg   MCHC 32.7 30.0 - 36.0 g/dL   RDW 40.9 81.1 - 91.4 %   Platelets 219 150 - 400 K/uL  Basic metabolic panel  Result Value Ref Range   Sodium 138 135 - 145 mmol/L   Potassium 3.8 3.5 - 5.1 mmol/L   Chloride 103 101 - 111 mmol/L   CO2 25 22 - 32 mmol/L   Glucose, Bld 166 (H) 65 - 99 mg/dL   BUN 17 6 - 20 mg/dL   Creatinine, Ser 7.82 (H) 0.44 - 1.00 mg/dL   Calcium 9.4 8.9 - 95.6 mg/dL   GFR calc non Af Amer 39 (L) >60 mL/min   GFR calc Af Amer 45 (L) >60 mL/min   Anion gap 10 5 - 15  Differential  Result Value Ref Range   Neutrophils Relative % 59 43 - 77 %   Neutro Abs 8.0 (H) 1.7 - 7.7 K/uL   Lymphocytes Relative 33 12 - 46 %   Lymphs Abs 4.4 (H) 0.7 - 4.0 K/uL   Monocytes Relative 6 3 - 12 %   Monocytes Absolute 0.8 0.1 - 1.0 K/uL   Eosinophils Relative 2 0 - 5 %   Eosinophils Absolute 0.2 0.0 - 0.7 K/uL   Basophils Relative 0 0 - 1 %   Basophils Absolute 0.0 0.0 - 0.1 K/uL  I-stat troponin, ED  (not at [facility])  Result Value Ref Range   Troponin i, poc 0.00 0.00 - 0.08 ng/mL   Comment 3            EKG Interpretation   Date/Time:  Tuesday May 26 2014 05:48:08 EDT Ventricular Rate:  57 PR Interval:  195 QRS Duration: 113 QT Interval:  472 QTC Calculation: 460 R Axis:   35 Text Interpretation:  Sinus rhythm Incomplete right bundle branch block  Low voltage, precordial leads When compared with ECG of 04/22/2010, There  has been improvement in  Nonspecific ST and T wave abnormality Confirmed  by [facility]  MD, Azlin Zilberman ([id number]) on 05/26/2014 5:51:11 AM      MDM   Final diagnoses:  Peripheral vertigo, unspecified laterality  Renal insufficiency    Dizziness in pattern that seems most consistent with peripheral vertigo. Old  records are reviewed and she had been admitted to the hospital with dizziness and diplopia in 2012, but had normal MRI scan at that time. She will be given IV ondansetron and oral meclizine.  Laboratory workup is significant for renal insufficiency which is not significantly changed from baseline. Following above-noted treatment, there is some improvement  but she is still having significant dizziness. She'll be given a dose of lorazepam and rechecked. Case is signed out to Dr. Romeo AppleHarrison.  Dione Boozeavid Glennon Kopko, MD 05/26/14 [phone]

## 2014-05-26 NOTE — ED Notes (Signed)
Pt. Woke up this morning at 4 am with dizziness. States she couldn't focus and every time she changed positions she would get nauseated and vomit.

## 2014-05-26 NOTE — Addendum Note (Signed)
Addended by: Beather ArbourPATEL, RUSHIL V on: 05/26/2014 05:26 PM   Modules accepted: Orders, SmartSet

## 2014-05-26 NOTE — Discharge Instructions (Signed)

## 2014-05-26 NOTE — ED Notes (Signed)
Bolus dripping in slowly due to pt hx of CKD.

## 2014-09-04 ENCOUNTER — Encounter: Payer: Medicare Other | Admitting: Internal Medicine

## 2014-10-16 ENCOUNTER — Other Ambulatory Visit: Payer: Self-pay | Admitting: Internal Medicine

## 2014-10-16 ENCOUNTER — Ambulatory Visit (INDEPENDENT_AMBULATORY_CARE_PROVIDER_SITE_OTHER): Payer: Medicare Other | Admitting: Internal Medicine

## 2014-10-16 ENCOUNTER — Encounter: Payer: Self-pay | Admitting: Internal Medicine

## 2014-10-16 VITALS — BP 150/61 | HR 50 | Temp 98.1°F | Ht 68.0 in | Wt 189.6 lb

## 2014-10-16 DIAGNOSIS — Z23 Encounter for immunization: Secondary | ICD-10-CM

## 2014-10-16 DIAGNOSIS — I5032 Chronic diastolic (congestive) heart failure: Secondary | ICD-10-CM | POA: Diagnosis not present

## 2014-10-16 DIAGNOSIS — E2839 Other primary ovarian failure: Secondary | ICD-10-CM

## 2014-10-16 DIAGNOSIS — N2581 Secondary hyperparathyroidism of renal origin: Secondary | ICD-10-CM | POA: Diagnosis not present

## 2014-10-16 DIAGNOSIS — I10 Essential (primary) hypertension: Secondary | ICD-10-CM

## 2014-10-16 DIAGNOSIS — N183 Chronic kidney disease, stage 3 unspecified: Secondary | ICD-10-CM

## 2014-10-16 DIAGNOSIS — H8110 Benign paroxysmal vertigo, unspecified ear: Secondary | ICD-10-CM

## 2014-10-16 DIAGNOSIS — E559 Vitamin D deficiency, unspecified: Secondary | ICD-10-CM | POA: Diagnosis not present

## 2014-10-16 DIAGNOSIS — Z7189 Other specified counseling: Secondary | ICD-10-CM

## 2014-10-16 NOTE — Progress Notes (Signed)
   Subjective:    Patient ID: Tonyetta Berko, female    DOB: 07-22-1940, 74 y.o.   MRN: [id number]  HPI Ms. Drummonds is a 74 year old female with HTN, CAD s/p CABG 1996, hyperlipidemia, BPPV who presents today for follow-up visit. Please see assessment & plan for documentation of chronic medical problems.   Review of Systems  Respiratory: Negative for shortness of breath.   Cardiovascular: Negative for chest pain and leg swelling.  Gastrointestinal: Negative for nausea, vomiting, abdominal pain, diarrhea and blood in stool.  Genitourinary: Negative for dysuria.  Neurological: Negative for dizziness and syncope.       Objective:   Physical Exam Constitutional: Overweight, elderly African-American female. No distress.  Head: Normocephalic and atraumatic.  Eyes: Conjunctivae are normal. Pupils are 3 mm, direct, consensual, near.  Cardiovascular: Normal rate, regular rhythm and normal heart sounds.  No gallop, friction rub, murmur heard. Pulmonary/Chest: Effort normal. No respiratory distress. No wheezes, rales.  Abdominal: Soft. Bowel sounds are normal. No distension. No tenderness.  Neurological: Alert and oriented to person, place, and time.  Coordination normal.  Skin: Warm and dry. Not diaphoretic.  Psychiatric: Affect mobile and congruent with thought content         Assessment & Plan:

## 2014-10-16 NOTE — Patient Instructions (Addendum)
I will see you back after your DEXA scan to see if we need to start a stronger bone supplement.  I will also look into whether you should be on atenolol for your heart rate.  Keep up the good work!

## 2014-10-17 DIAGNOSIS — N2581 Secondary hyperparathyroidism of renal origin: Secondary | ICD-10-CM

## 2014-10-17 DIAGNOSIS — Z0000 Encounter for general adult medical examination without abnormal findings: Secondary | ICD-10-CM | POA: Insufficient documentation

## 2014-10-17 DIAGNOSIS — I5032 Chronic diastolic (congestive) heart failure: Secondary | ICD-10-CM | POA: Insufficient documentation

## 2014-10-17 DIAGNOSIS — I5030 Unspecified diastolic (congestive) heart failure: Secondary | ICD-10-CM | POA: Insufficient documentation

## 2014-10-17 DIAGNOSIS — Z23 Encounter for immunization: Secondary | ICD-10-CM | POA: Insufficient documentation

## 2014-10-17 HISTORY — DX: Secondary hyperparathyroidism of renal origin: N25.81

## 2014-10-17 LAB — RENAL FUNCTION PANEL
Albumin: 4.3 g/dL (ref 3.5–4.8)
BUN/Creatinine Ratio: 11 (ref 11–26)
BUN: 14 mg/dL (ref 8–27)
CO2: 25 mmol/L (ref 18–29)
Calcium: 9.6 mg/dL (ref 8.7–10.3)
Chloride: 103 mmol/L (ref 97–108)
Creatinine, Ser: 1.29 mg/dL — ABNORMAL HIGH (ref 0.57–1.00)
GFR calc Af Amer: 46 mL/min/{1.73_m2} — ABNORMAL LOW (ref >59)
GFR, EST NON AFRICAN AMERICAN: 40 mL/min/{1.73_m2} — AB (ref >59)
GLUCOSE: 94 mg/dL (ref 65–99)
PHOSPHORUS: 2.9 mg/dL (ref 2.5–4.5)
Potassium: 4 mmol/L (ref 3.5–5.2)
Sodium: 142 mmol/L (ref 134–144)

## 2014-10-17 LAB — PTH, INTACT AND CALCIUM
CALCIUM: 9.5 mg/dL (ref 8.7–10.3)
PTH: 79 pg/mL — ABNORMAL HIGH (ref 15–65)

## 2014-10-17 LAB — VITAMIN D 25 HYDROXY (VIT D DEFICIENCY, FRACTURES): VIT D 25 HYDROXY: 19.2 ng/mL — AB (ref 30.0–100.0)

## 2014-10-17 NOTE — Assessment & Plan Note (Signed)
She is agreeable to receiving Prevnar today.

## 2014-10-17 NOTE — Assessment & Plan Note (Addendum)
Seen in ED in May for an episode of dizziness that started when she sat up from rest and would not stop. She felt very unstable on her feet as though the room was spinning and asked one of her children to bring her to the ED where she improved with IV Zofran and oral meclizine. Since coming home, she has not required any additional meclizine. Reports one prior episode in April 2012 at which time she described similar symptoms per chart review. Brain MRI at that time was reassuring for no infarct.

## 2014-10-17 NOTE — Assessment & Plan Note (Signed)
She reports not picking up the ergocalciferol I had prescribed at her last appointment and is just taking the cholecalciferol 400u daily like she has in the past. She is agreeable to retesting Vitamin D to see where she stands and being scheduled for her DEXA scan.  ADDENDUM 10/17/2014  9:00 PM:  Vitamin D 19, mildly improved from 17. Will instruct her to double her current supplement.Marland Kitchen

## 2014-10-17 NOTE — Assessment & Plan Note (Signed)
She hasn't gotten her DEXA scan as of yet but is agreeable to scheduling it today when we discussed what we can do to maximize her chances of staying outside of the hospital, especially in light of her CKD Stage 3.

## 2014-10-17 NOTE — Assessment & Plan Note (Signed)
Educated her about CKD Stage 3, and she is not interested in being followed by a nephrologist since she has little interest in dialysis should her renal function worsen. She is agreeable to being monitored in our clinic so will check renal function panel and PTH today.  ADDENDUM 10/17/2014  9:10 PM:  PTH mildly elevated at 74 suggestive of mild metabolic bone disease. Phosphorous & calcium reassuring.

## 2014-10-23 NOTE — Progress Notes (Signed)
Internal Medicine Clinic Attending  Case discussed with Dr. Patel,Rushil soon after the resident saw the patient.  We reviewed the resident's history and exam and pertinent patient test results.  I agree with the assessment, diagnosis, and plan of care documented in the resident's note. 

## 2014-11-17 ENCOUNTER — Inpatient Hospital Stay: Admission: RE | Admit: 2014-11-17 | Payer: Medicare Other | Source: Ambulatory Visit

## 2014-11-20 ENCOUNTER — Other Ambulatory Visit: Payer: Self-pay | Admitting: Oncology

## 2014-11-20 ENCOUNTER — Telehealth: Payer: Self-pay | Admitting: Internal Medicine

## 2014-11-20 MED ORDER — METHYLPREDNISOLONE 4 MG PO TBPK: ORAL_TABLET | ORAL | Status: DC

## 2014-11-20 NOTE — Telephone Encounter (Signed)
Tried to call pt, went straight to vmail, could we send something to the pharm and have her come in Monday? Sending to dr patel and attending

## 2014-11-20 NOTE — Telephone Encounter (Signed)
I sent in an RX for a steroid dose pack. She is not diabetic

## 2014-11-20 NOTE — Telephone Encounter (Signed)
Patient states her Gout is acting up x 2 days in her feet.  Patient states she has taken OTC meds ibuprofen  and it is not working.  Patient unable to come in this afternoon as it is hard for her to walk.  Patient would like to know if she can have something else in place of the OTC  Ibuprofen.

## 2014-11-20 NOTE — Telephone Encounter (Signed)
Called pt informed of dose pk, appt tues am dr Senaida Oresrichardson

## 2014-11-24 ENCOUNTER — Ambulatory Visit: Payer: Medicare Other | Admitting: Internal Medicine

## 2015-02-12 ENCOUNTER — Encounter: Payer: Medicare Other | Admitting: Internal Medicine

## 2015-04-27 DIAGNOSIS — H25813 Combined forms of age-related cataract, bilateral: Secondary | ICD-10-CM | POA: Diagnosis not present

## 2015-04-27 DIAGNOSIS — H401133 Primary open-angle glaucoma, bilateral, severe stage: Secondary | ICD-10-CM | POA: Diagnosis not present

## 2015-04-27 DIAGNOSIS — E119 Type 2 diabetes mellitus without complications: Secondary | ICD-10-CM | POA: Diagnosis not present

## 2015-04-27 LAB — HM DIABETES EYE EXAM

## 2015-04-29 ENCOUNTER — Telehealth: Payer: Self-pay | Admitting: Internal Medicine

## 2015-04-29 NOTE — Telephone Encounter (Signed)
APPT. REMINDER CALL, LMTCB °

## 2015-04-30 ENCOUNTER — Ambulatory Visit (INDEPENDENT_AMBULATORY_CARE_PROVIDER_SITE_OTHER): Payer: Medicare Other | Admitting: Internal Medicine

## 2015-04-30 ENCOUNTER — Encounter: Payer: Self-pay | Admitting: Internal Medicine

## 2015-04-30 VITALS — BP 120/54 | HR 40 | Temp 98.4°F | Ht 68.0 in | Wt 185.4 lb

## 2015-04-30 DIAGNOSIS — E559 Vitamin D deficiency, unspecified: Secondary | ICD-10-CM | POA: Diagnosis not present

## 2015-04-30 DIAGNOSIS — Z7982 Long term (current) use of aspirin: Secondary | ICD-10-CM

## 2015-04-30 DIAGNOSIS — E2839 Other primary ovarian failure: Secondary | ICD-10-CM | POA: Insufficient documentation

## 2015-04-30 DIAGNOSIS — I2510 Atherosclerotic heart disease of native coronary artery without angina pectoris: Secondary | ICD-10-CM

## 2015-04-30 DIAGNOSIS — Z8639 Personal history of other endocrine, nutritional and metabolic disease: Secondary | ICD-10-CM

## 2015-04-30 DIAGNOSIS — Z951 Presence of aortocoronary bypass graft: Secondary | ICD-10-CM

## 2015-04-30 DIAGNOSIS — Z79899 Other long term (current) drug therapy: Secondary | ICD-10-CM

## 2015-04-30 DIAGNOSIS — I10 Essential (primary) hypertension: Secondary | ICD-10-CM | POA: Diagnosis not present

## 2015-04-30 DIAGNOSIS — H8110 Benign paroxysmal vertigo, unspecified ear: Secondary | ICD-10-CM | POA: Diagnosis not present

## 2015-04-30 MED ORDER — ASPIRIN EC 81 MG PO TBEC: 81.0000 mg | DELAYED_RELEASE_TABLET | Freq: Every day | ORAL | Status: DC

## 2015-04-30 MED ORDER — MECLIZINE HCL 12.5 MG PO TABS: 12.5000 mg | ORAL_TABLET | Freq: Three times a day (TID) | ORAL | Status: DC | PRN

## 2015-04-30 NOTE — Assessment & Plan Note (Signed)
Overview She acknowledges having 1-2 episodes a year which are precipitated by position improve when she lay supine and closes her eyes. She is inquiring whether antevert as of use to her.  Assessment BPPV of mild severity  Plan -Refilled meclizine 12.5 mg 3 times daily as needed [reduced dosage given anticholinergic effects] -Consider vestibular rehab should symptoms be significantly debilitating

## 2015-04-30 NOTE — Assessment & Plan Note (Signed)
Overview She reports adherence to vitamin D3 400 units daily. She has not been able to complete her DEXA scan  In the interval following her last visit though still interested in getting it done to assess her risk of osteoporosis.  Assessment Mild Vitamin D deficiency on supplement therapy  Plan -Reorder DEXA today and encourage her to complete

## 2015-04-30 NOTE — Patient Instructions (Signed)
For your aspirin, we have reduced your dose.  If you notice that your dizziness or lightheadedness is getting worse, we can always decrease your atenolol dose.

## 2015-04-30 NOTE — Progress Notes (Signed)
   Subjective:    Patient ID: Jill Golden, female    DOB: Sep 23, 1940, 75 y.o.   MRN: [id number]  HPI Ms. Jill Golden is a 75 year old female with HTN, CAD s/p CABG 1996, hyperlipidemia, BPPV who presents today for follow-up visit. Please see assessment & plan for documentation of chronic medical problems.    Review of Systems  Respiratory: Negative for shortness of breath.   Cardiovascular: Negative for chest pain and leg swelling.  Gastrointestinal: Negative for nausea, vomiting, abdominal pain and diarrhea.  Neurological: Positive for dizziness.       Objective:   Physical Exam  Constitutional: She is oriented to person, place, and time. She appears well-developed and well-nourished. No distress.  Elderly, African-American female  HENT:  Head: Normocephalic and atraumatic.  Eyes: Conjunctivae are normal. No scleral icterus.  Neck: No tracheal deviation present.  Cardiovascular: Normal rate and regular rhythm.  Exam reveals no gallop and no friction rub.   No murmur heard. Pulmonary/Chest: Effort normal and breath sounds normal. No stridor. No respiratory distress. She has no wheezes. She has no rales.  Neurological: She is alert and oriented to person, place, and time.  Skin: Skin is warm and dry. She is not diaphoretic.  Psychiatric: She has a normal mood and affect. Her behavior is normal.          Assessment & Plan:

## 2015-04-30 NOTE — Assessment & Plan Note (Signed)
Overview In reviewing her medications, it appeared she was taking aspirin 325 mg daily. She had been on this dose following her CABG procedure back in 1996.  Assessment Coronary artery disease status post CABG.  Plan -Decreased her dose to aspirin 81 mg to offset the risk of adverse bleeding while maintaining benefit for secondary prevention

## 2015-04-30 NOTE — Assessment & Plan Note (Signed)
Overview Her blood pressure today is 120/54 on triamterene/HCTZ 37.5/25 mg daily and atenolol 50 mg daily. Of note, she was bradycardic with heart rate 40 though she denies any symptoms of dizziness, lightheadedness, decreased energy has been persistent and associated with activity.  Assessment Well-controlled hypertension  Plan -Continue triamterene/HCTZ 37.5/25 mg daily along with atenolol 50 mg daily

## 2015-05-02 NOTE — Progress Notes (Signed)
Case discussed with Dr. Patel at the time of the visit.  We reviewed the resident's history and exam and pertinent patient test results.  I agree with the assessment, diagnosis and plan of care documented in the resident's note. 

## 2015-05-03 DIAGNOSIS — H25811 Combined forms of age-related cataract, right eye: Secondary | ICD-10-CM | POA: Diagnosis not present

## 2015-05-03 DIAGNOSIS — H2511 Age-related nuclear cataract, right eye: Secondary | ICD-10-CM | POA: Diagnosis not present

## 2015-05-05 ENCOUNTER — Encounter: Payer: Self-pay | Admitting: *Deleted

## 2015-06-28 DIAGNOSIS — Z880 Allergy status to penicillin: Secondary | ICD-10-CM | POA: Diagnosis not present

## 2015-06-28 DIAGNOSIS — M25561 Pain in right knee: Secondary | ICD-10-CM | POA: Diagnosis not present

## 2015-06-28 DIAGNOSIS — R9431 Abnormal electrocardiogram [ECG] [EKG]: Secondary | ICD-10-CM | POA: Diagnosis not present

## 2015-06-28 DIAGNOSIS — R270 Ataxia, unspecified: Secondary | ICD-10-CM | POA: Diagnosis not present

## 2015-06-28 DIAGNOSIS — S8991XA Unspecified injury of right lower leg, initial encounter: Secondary | ICD-10-CM | POA: Diagnosis not present

## 2015-06-28 DIAGNOSIS — Z87891 Personal history of nicotine dependence: Secondary | ICD-10-CM | POA: Diagnosis not present

## 2015-06-28 DIAGNOSIS — E876 Hypokalemia: Secondary | ICD-10-CM | POA: Diagnosis not present

## 2015-06-28 DIAGNOSIS — R001 Bradycardia, unspecified: Secondary | ICD-10-CM | POA: Diagnosis not present

## 2015-06-28 DIAGNOSIS — M25551 Pain in right hip: Secondary | ICD-10-CM | POA: Diagnosis not present

## 2015-06-28 DIAGNOSIS — E039 Hypothyroidism, unspecified: Secondary | ICD-10-CM | POA: Diagnosis not present

## 2015-06-28 DIAGNOSIS — M1711 Unilateral primary osteoarthritis, right knee: Secondary | ICD-10-CM | POA: Diagnosis not present

## 2015-06-28 DIAGNOSIS — I081 Rheumatic disorders of both mitral and tricuspid valves: Secondary | ICD-10-CM | POA: Diagnosis not present

## 2015-06-28 DIAGNOSIS — Z7982 Long term (current) use of aspirin: Secondary | ICD-10-CM | POA: Diagnosis not present

## 2015-06-28 DIAGNOSIS — S79911A Unspecified injury of right hip, initial encounter: Secondary | ICD-10-CM | POA: Diagnosis not present

## 2015-06-28 DIAGNOSIS — I10 Essential (primary) hypertension: Secondary | ICD-10-CM | POA: Diagnosis not present

## 2015-06-28 DIAGNOSIS — R2689 Other abnormalities of gait and mobility: Secondary | ICD-10-CM | POA: Diagnosis not present

## 2015-06-28 DIAGNOSIS — R112 Nausea with vomiting, unspecified: Secondary | ICD-10-CM | POA: Diagnosis not present

## 2015-06-28 DIAGNOSIS — Z79899 Other long term (current) drug therapy: Secondary | ICD-10-CM | POA: Diagnosis not present

## 2015-06-28 DIAGNOSIS — N179 Acute kidney failure, unspecified: Secondary | ICD-10-CM | POA: Diagnosis not present

## 2015-06-28 DIAGNOSIS — I272 Other secondary pulmonary hypertension: Secondary | ICD-10-CM | POA: Diagnosis not present

## 2015-06-28 DIAGNOSIS — R42 Dizziness and giddiness: Secondary | ICD-10-CM | POA: Diagnosis not present

## 2015-06-28 DIAGNOSIS — E785 Hyperlipidemia, unspecified: Secondary | ICD-10-CM | POA: Diagnosis not present

## 2015-06-29 DIAGNOSIS — R42 Dizziness and giddiness: Secondary | ICD-10-CM | POA: Diagnosis not present

## 2015-06-29 DIAGNOSIS — N179 Acute kidney failure, unspecified: Secondary | ICD-10-CM | POA: Diagnosis not present

## 2015-06-30 DIAGNOSIS — I517 Cardiomegaly: Secondary | ICD-10-CM | POA: Diagnosis not present

## 2015-06-30 DIAGNOSIS — I083 Combined rheumatic disorders of mitral, aortic and tricuspid valves: Secondary | ICD-10-CM | POA: Diagnosis not present

## 2015-06-30 DIAGNOSIS — R42 Dizziness and giddiness: Secondary | ICD-10-CM | POA: Diagnosis not present

## 2015-06-30 DIAGNOSIS — I272 Other secondary pulmonary hypertension: Secondary | ICD-10-CM | POA: Diagnosis not present

## 2015-08-13 ENCOUNTER — Encounter: Payer: Self-pay | Admitting: Internal Medicine

## 2015-08-13 ENCOUNTER — Ambulatory Visit (INDEPENDENT_AMBULATORY_CARE_PROVIDER_SITE_OTHER): Payer: Medicare Other | Admitting: Internal Medicine

## 2015-08-13 VITALS — BP 118/69 | HR 70 | Temp 97.6°F | Ht 68.0 in | Wt 186.1 lb

## 2015-08-13 DIAGNOSIS — H8110 Benign paroxysmal vertigo, unspecified ear: Secondary | ICD-10-CM

## 2015-08-13 DIAGNOSIS — Z87891 Personal history of nicotine dependence: Secondary | ICD-10-CM

## 2015-08-13 DIAGNOSIS — I10 Essential (primary) hypertension: Secondary | ICD-10-CM

## 2015-08-13 DIAGNOSIS — E559 Vitamin D deficiency, unspecified: Secondary | ICD-10-CM

## 2015-08-13 NOTE — Assessment & Plan Note (Signed)
Assessment Per review of chart, it appears she is not undergone DEXA bone imaging as was recommended her last office visit. She expressed to me today that she is still interested in my recommendation but never had anyone call her back.  Plan -Reordered DEXA scan and recommended she hold her Vitamin D supplement until when she has it which will be next week

## 2015-08-13 NOTE — Progress Notes (Signed)
Case discussed with Dr. Patel at the time of the visit.  We reviewed the resident's history and exam and pertinent patient test results.  I agree with the assessment, diagnosis and plan of care documented in the resident's note. 

## 2015-08-13 NOTE — Assessment & Plan Note (Signed)
See discussion under Vitamin D deficiency.

## 2015-08-13 NOTE — Patient Instructions (Addendum)
Your BP is beautiful. Stay off the atenolol.   Only take meclizine if you are having another dizzy spell.  Please complete your bone scan before we see each other back.

## 2015-08-13 NOTE — Progress Notes (Signed)
   CC: Dizziness  HPI:  Ms.Zimal Suzie Semar is a 75 y.o. female who presents today for dizziness. Please see assessment & plan for status of chronic medical problems.   Past Medical History:  Diagnosis Date  . Anxiety   . BPPV (benign paroxysmal positional vertigo)   . CAD (coronary artery disease)    4 stents placed prior to CABAG (1996 ? ) , s/p CABG 1996   . Diabetes mellitus   . Diabetes mellitus, type 2 (HCC) 05/09/2010  . Dizziness   . Hypertension   . Hypothyroid     Review of Systems:  Please see each problem below for a pertinent review of systems.  Physical Exam:  Vitals:   08/13/15 1320 08/13/15 1351  BP: (!) 150/64 118/69  Pulse: 83 70  Temp: 97.6 F (36.4 C)   TempSrc: Oral   SpO2: 100%   Weight: 186 lb 1.6 oz (84.4 kg)   Height: 5\' 8"  (1.727 m)    Constitutional: Elderly African-American female. No distress.  HEENT: Normocephalic and atraumatic. Conjunctivae are normal.  Cardiovascular: Normal rate, regular rhythm and normal heart sounds.  No gallop, friction rub, murmur heard. Pulmonary/Chest: Effort normal. No respiratory distress. No wheezes, rales.  Abdominal: Soft. Bowel sounds are normal. No distension. No tenderness.  Neurological: Alert and oriented to person, place, and time. Coordination normal.  Skin: Warm and dry. Not diaphoretic.     Assessment & Plan:   See Encounters Tab for problem based charting.  Patient discussed with Dr. Josem Kaufmann

## 2015-08-13 NOTE — Assessment & Plan Note (Signed)
Assessment Her initial blood pressure is elevated at 150/64 though improved to 118/69. She is currently on triamterene/HCTZ 37.5/25 mg tablets to be taken once daily. She denies any signs of hypertensive urgency, like chest pain, difficulty breathing, headache.  Given her history of coronary artery disease, blood pressure goal of 140/90 would be optimal though may be limited given the recent syncopal episode she experienced in the setting of symptomatic bradycardia from beta blocker therapy.  Plan -Continue triamterene/HCTZ 37.5/25 mg tablets to be taken once daily

## 2015-08-13 NOTE — Assessment & Plan Note (Signed)
Assessment She was hospitalized in June at [facility] [records available in Care Everywhere] for a syncopal episode thought to be in the setting of bradycardia from her atenolol. This medication was discontinued, and following discharge, she has only experienced one episode of dizziness for which she took 2 tablets of meclizine on 07/10/15.  She described this episode is far worse than the prior episodes of vertigo she has experienced and has been previously labeled as BPPV. As such, she is confused about how to take the meclizine.  At her last office visit with me 04/21/15, she was bradycardic with heart rate 41. Though I was not convinced it may have been related to her beta blocker, I wonder now if it was truly bradycardia she is experiencing as an adverse effect of her long-term beta blocker therapy.  Plan -Discontinued atenolol 50 mg altogether -Recommended she take meclizine 12.5 mg only as needed after an acute onset of vertigo though I suspect if she does not have additional episodes following her office visit, it may fall been related to the atenolol for all these years

## 2015-08-19 ENCOUNTER — Other Ambulatory Visit: Payer: Medicare Other

## 2015-08-26 ENCOUNTER — Other Ambulatory Visit: Payer: Medicare Other

## 2015-08-28 ENCOUNTER — Other Ambulatory Visit: Payer: Self-pay | Admitting: Internal Medicine

## 2015-08-28 DIAGNOSIS — I10 Essential (primary) hypertension: Secondary | ICD-10-CM

## 2015-08-28 DIAGNOSIS — E785 Hyperlipidemia, unspecified: Secondary | ICD-10-CM

## 2015-08-28 DIAGNOSIS — E038 Other specified hypothyroidism: Secondary | ICD-10-CM

## 2015-08-31 NOTE — Telephone Encounter (Signed)
As this patient was seen by me and deemed necessary for their treatment, I will refill this prescription for pravastatin, levothyroxine, triamterene/HCTZ.

## 2016-05-26 ENCOUNTER — Encounter: Payer: Medicare Other | Admitting: Internal Medicine

## 2016-05-26 ENCOUNTER — Encounter: Payer: Medicare Other | Admitting: Dietician

## 2016-06-12 ENCOUNTER — Encounter: Payer: Self-pay | Admitting: *Deleted

## 2016-11-17 ENCOUNTER — Encounter: Payer: Medicare Other | Admitting: Internal Medicine

## 2016-11-23 ENCOUNTER — Other Ambulatory Visit: Payer: Self-pay | Admitting: Internal Medicine

## 2016-11-23 DIAGNOSIS — E038 Other specified hypothyroidism: Secondary | ICD-10-CM

## 2016-11-23 DIAGNOSIS — E785 Hyperlipidemia, unspecified: Secondary | ICD-10-CM

## 2016-11-23 DIAGNOSIS — I10 Essential (primary) hypertension: Secondary | ICD-10-CM

## 2016-11-23 MED ORDER — LEVOTHYROXINE SODIUM 50 MCG PO TABS: ORAL_TABLET | ORAL | 0 refills | Status: DC

## 2016-11-23 MED ORDER — TRIAMTERENE-HCTZ 37.5-25 MG PO TABS: 1.0000 | ORAL_TABLET | Freq: Every day | ORAL | 0 refills | Status: DC

## 2016-11-23 MED ORDER — PRAVASTATIN SODIUM 40 MG PO TABS: 40.0000 mg | ORAL_TABLET | Freq: Every day | ORAL | 0 refills | Status: DC

## 2016-11-23 NOTE — Telephone Encounter (Signed)
Refilled levothyroxine 50mcg daily, pravastatin 40mg  daily, triamterine-hctz 37.5-25mg  for 30 days.

## 2016-11-23 NOTE — Telephone Encounter (Signed)
Patient is requesting refills on medicine °

## 2016-12-22 ENCOUNTER — Encounter: Payer: Medicare Other | Admitting: Internal Medicine

## 2016-12-22 ENCOUNTER — Ambulatory Visit (INDEPENDENT_AMBULATORY_CARE_PROVIDER_SITE_OTHER): Payer: Medicare Other | Admitting: Internal Medicine

## 2016-12-22 ENCOUNTER — Encounter: Payer: Self-pay | Admitting: Internal Medicine

## 2016-12-22 ENCOUNTER — Other Ambulatory Visit: Payer: Self-pay

## 2016-12-22 VITALS — BP 130/76 | HR 78 | Temp 98.0°F | Ht 68.0 in | Wt 181.8 lb

## 2016-12-22 DIAGNOSIS — H8110 Benign paroxysmal vertigo, unspecified ear: Secondary | ICD-10-CM | POA: Diagnosis not present

## 2016-12-22 DIAGNOSIS — Z955 Presence of coronary angioplasty implant and graft: Secondary | ICD-10-CM | POA: Diagnosis not present

## 2016-12-22 DIAGNOSIS — E039 Hypothyroidism, unspecified: Secondary | ICD-10-CM

## 2016-12-22 DIAGNOSIS — Z79899 Other long term (current) drug therapy: Secondary | ICD-10-CM

## 2016-12-22 DIAGNOSIS — R7303 Prediabetes: Secondary | ICD-10-CM | POA: Diagnosis not present

## 2016-12-22 DIAGNOSIS — E785 Hyperlipidemia, unspecified: Secondary | ICD-10-CM

## 2016-12-22 DIAGNOSIS — N183 Chronic kidney disease, stage 3 (moderate): Secondary | ICD-10-CM

## 2016-12-22 DIAGNOSIS — Z951 Presence of aortocoronary bypass graft: Secondary | ICD-10-CM | POA: Diagnosis not present

## 2016-12-22 DIAGNOSIS — I2510 Atherosclerotic heart disease of native coronary artery without angina pectoris: Secondary | ICD-10-CM | POA: Diagnosis not present

## 2016-12-22 DIAGNOSIS — Z87891 Personal history of nicotine dependence: Secondary | ICD-10-CM | POA: Diagnosis not present

## 2016-12-22 DIAGNOSIS — M810 Age-related osteoporosis without current pathological fracture: Secondary | ICD-10-CM

## 2016-12-22 DIAGNOSIS — E038 Other specified hypothyroidism: Secondary | ICD-10-CM

## 2016-12-22 DIAGNOSIS — Z79890 Hormone replacement therapy (postmenopausal): Secondary | ICD-10-CM

## 2016-12-22 DIAGNOSIS — I5030 Unspecified diastolic (congestive) heart failure: Secondary | ICD-10-CM | POA: Diagnosis not present

## 2016-12-22 DIAGNOSIS — M25561 Pain in right knee: Secondary | ICD-10-CM | POA: Diagnosis not present

## 2016-12-22 DIAGNOSIS — Z23 Encounter for immunization: Secondary | ICD-10-CM

## 2016-12-22 DIAGNOSIS — I130 Hypertensive heart and chronic kidney disease with heart failure and stage 1 through stage 4 chronic kidney disease, or unspecified chronic kidney disease: Secondary | ICD-10-CM

## 2016-12-22 DIAGNOSIS — I10 Essential (primary) hypertension: Secondary | ICD-10-CM

## 2016-12-22 DIAGNOSIS — Z7982 Long term (current) use of aspirin: Secondary | ICD-10-CM | POA: Diagnosis not present

## 2016-12-22 DIAGNOSIS — Z7189 Other specified counseling: Secondary | ICD-10-CM

## 2016-12-22 DIAGNOSIS — G8929 Other chronic pain: Secondary | ICD-10-CM

## 2016-12-22 LAB — GLUCOSE, CAPILLARY: Glucose-Capillary: 79 mg/dL (ref 65–99)

## 2016-12-22 LAB — POCT GLYCOSYLATED HEMOGLOBIN (HGB A1C): Hemoglobin A1C: 5.8

## 2016-12-22 MED ORDER — MECLIZINE HCL 12.5 MG PO TABS
12.5000 mg | ORAL_TABLET | Freq: Three times a day (TID) | ORAL | 1 refills | Status: DC | PRN
Start: 2016-12-22 — End: 2018-07-04

## 2016-12-22 MED ORDER — PRAVASTATIN SODIUM 40 MG PO TABS: 40.0000 mg | ORAL_TABLET | Freq: Every day | ORAL | 5 refills | Status: DC

## 2016-12-22 MED ORDER — LEVOTHYROXINE SODIUM 50 MCG PO TABS: ORAL_TABLET | ORAL | 5 refills | Status: DC

## 2016-12-22 MED ORDER — TRIAMTERENE-HCTZ 37.5-25 MG PO TABS: 1.0000 | ORAL_TABLET | Freq: Every day | ORAL | 5 refills | Status: DC

## 2016-12-22 NOTE — Assessment & Plan Note (Signed)
Assessment Patient endorses intermittent right knee pain for the last year. She states that if she is up and moving, it feels better, and when she lies down at night, it is worse. She states that sometimes it wakes her up from sleep. She describes it as a sharp pain. She denies swelling, fevers, or trauma to the area. She states she has been trying an over-the-counter cream (Emu) which does not provide much relief. On physical exam, right knee is not swollen, not erythematous, and not warm to palpation. She had a right knee x-ray in June 2017, which showed mild patellofemoral degeneration and no acute changes. Given her age, the description of her symptoms, and x-ray and physical exam findings, I suspect her intermittent right knee pain is most likely from osteoarthritis. We discussed getting another x-ray or trying a stronger pain medication, however the patient states that she does not want either of these.  Plan - Recommended Tylenol 1000 mg 3 times daily for 1 week to see if this helps. If not, she can discontinue the medication. - Advised patient that if the pain gets worse or becomes red, hot, and swollen to call the clinic or to go to the emergency room.

## 2016-12-22 NOTE — Progress Notes (Signed)
Medicine attending: I personally interviewed and briefly examined this patient on the day of the patient visit and reviewed pertinent clinical ,laboratory data  with resident physician Dr. Scherrie GerlachJennifer Huang and we discussed a management plan.

## 2016-12-22 NOTE — Assessment & Plan Note (Signed)
Stable. Refilled pravastatin 40 mg daily. 

## 2016-12-22 NOTE — Patient Instructions (Addendum)
FOLLOW-UP INSTRUCTIONS When: 6 months For: BP management What to bring: Medications  Jill Golden,  It was a pleasure to meet you today. - I have sent refills for all of your medications to the [facility] on [address]. - You received a flu shot today. - For your knee pain, you can try Tylenol 1000mg  three times daily for 1 week. If this helps, you can continue using the tylenol. If not, please stop taking the tylenol and try an over the counter cream as needed. - I have placed an order for a DEXA bone scan. [facility] will call you to let you know when this will be done. If you do not hear from them, please give them a call at [phone].

## 2016-12-22 NOTE — Assessment & Plan Note (Signed)
Screening for osteoporosis She has not gotten her DEXA scan because she states that they never called her to schedule an appointment. - Record her DEXA scan. - Encouraged patient to [facility] if she does not hear from them.  Vaccinations - Received flu shot in clinic today

## 2016-12-22 NOTE — Assessment & Plan Note (Signed)
Stable. - Refilled levothyroxine once daily

## 2016-12-22 NOTE — Assessment & Plan Note (Addendum)
Assessment Initial BP 158/88, repeat 130/76. Reports that her BP readings at home are more consistent with the latter reading, in the 130-138/70s range. Currently on triameterene/HCTZ 37.5-25mg  once daily. Atenolol discontinued last year due to syncopal episode secondary to bradycardia. Denies headaches or blurry vision.  Plan - Continue triamterene/HCTZ 37.5-25mg  once daily - Follow up in 6 months

## 2016-12-22 NOTE — Progress Notes (Signed)
   CC: blood pressure management  HPI:  [name] is a 76 y.o. female with PMH significant for HTN, CAD with multiple stents, hypothyroidism, HFpEF (echo in 06/2015 with LVEF >55% and mild LVH), HLD, and CKD3 who presents for management of blood pressure and for medication refills.  Please see the assessment and plan below for the status of the patient's chronic medical problems.  Past Medical History:  Diagnosis Date  . Anxiety   . BPPV (benign paroxysmal positional vertigo)   . CAD (coronary artery disease)    4 stents placed prior to CABAG (1996 ? ) , s/p CABG 1996   . Diabetes mellitus   . Diabetes mellitus, type 2 (HCC) 05/09/2010  . Dizziness   . Hypertension   . Hypothyroid    Social History: - Denies smoking, alcohol use, or other illicit drugs. - Raised in [place], [place]. Has lived in [place] for [place] years. - Has a granddaughter who is going to start medical school at [employer] next year and a grandson who is interested in business. - Travels a lot to [place] and to visit family.  Review of Systems:   Constitutional: Negative for diaphoresis, fever, malaise/fatigue, and weight loss.  HEENT: Negative for blurred vision, hearing loss, sinus pain, congestion, and sore throat. Respiratory: Negative for cough, shortness of breath and wheezing. Cardiovascular: Negative for chest pain, palpitations, orthopnea, and leg swelling. Gastrointestinal: Negative for abdominal pain, blood in stool, constipation, diarrhea. Genitourinary: Negative for dysuria and hematuria. Musculoskeletal: Negative for myalgias. Positive for chronic intermittent right knee pain. Neurological: Negative for focal weakness, weakness and headaches.  Positive for intermittent episodes of dizziness associated with N/V (only 1 time in the last year).  Physical Exam:  Vitals:   12/22/16 1425  BP: (!) 158/88  Pulse: 78  Temp: 98 F (36.7 C)  TempSrc: Oral  SpO2: 98%  Weight: 181  lb 12.8 oz (82.5 kg)  Height: 5\' 8"  (1.727 m)   GEN: Well-appearing, well-nourished elderly pleasant female. Alert and oriented. No acute distress. HENT: [place]/AT. Moist mucous membranes. No visible lesions. EYES: PERRL. Sclera non-icteric. Conjunctiva clear. RESP: Clear to auscultation bilaterally. No wheezes, rales, or rhonchi. No increased work of breathing. CV: Normal rate and regular rhythm. No murmurs, gallops, or rubs. No LE edema. ABD: Soft. Non-tender. Non-distended. Normoactive bowel sounds. EXT: No edema. Warm and well perfused. Right knee non tender to palpation, not erythematous, swollen, or hot. NEURO: Cranial nerves II-XII grossly intact. Able to lift all four extremities against gravity. No apparent audiovisual hallucinations. Speech fluent and appropriate. PSYCH: Patient is calm and pleasant. Appropriate affect. Well-groomed; speech is appropriate and on-subject.  Assessment & Plan:   See Encounters Tab for problem based charting.  Patient seen with Dr. Cyndie ChimeGranfortuna

## 2016-12-22 NOTE — Assessment & Plan Note (Signed)
Assessment Patient reports one episode approximately 3 weeks ago where she felt like the room was spinning. She states that the symptoms occurred immediately upon waking up, even when lying down. She sat up and the room continued to spin. This episode lasted approximately 2-3 days, and was similar to her previous episodes of dizziness. This is the only episode she's had for approximately 1 year. She took meclizine and is not sure whether it helped. Endorses nausea and emesis x2. She denies chest pain, palpitations, shortness of breath, or headache preceding the episode.  Plan - Continue meclizine when necessary

## 2017-02-05 ENCOUNTER — Telehealth: Payer: Self-pay | Admitting: Internal Medicine

## 2017-02-05 DIAGNOSIS — H409 Unspecified glaucoma: Secondary | ICD-10-CM

## 2017-02-05 NOTE — Telephone Encounter (Signed)
Pt needing a new Referral for an Eye Exam as soon as possible.

## 2017-02-07 NOTE — Telephone Encounter (Signed)
Thanks

## 2017-03-19 ENCOUNTER — Telehealth: Payer: Self-pay

## 2017-03-19 DIAGNOSIS — E039 Hypothyroidism, unspecified: Secondary | ICD-10-CM

## 2017-03-19 NOTE — Telephone Encounter (Signed)
Per patient the levothyroxine (SYNTHROID, LEVOTHROID) 50 MCG tablet cost around $100.00. Needs to speak with a nurse, please call pt back.

## 2017-03-20 MED ORDER — LEVOTHYROXINE SODIUM 25 MCG PO TABS: 50.0000 ug | ORAL_TABLET | Freq: Every day | ORAL | 5 refills | Status: DC

## 2017-03-20 NOTE — Progress Notes (Signed)
Therapeutic interchange to equivalent dose for $20 copay

## 2017-06-21 ENCOUNTER — Encounter: Payer: Medicare Other | Admitting: Internal Medicine

## 2017-06-21 NOTE — Addendum Note (Signed)
Addended by: Pearletha AlfredHUANG, Lillyn Wieczorek J on: 06/21/2017 08:32 AM   Modules accepted: Orders

## 2017-06-25 ENCOUNTER — Other Ambulatory Visit: Payer: Self-pay | Admitting: Internal Medicine

## 2017-06-25 DIAGNOSIS — E785 Hyperlipidemia, unspecified: Secondary | ICD-10-CM

## 2017-06-25 DIAGNOSIS — I10 Essential (primary) hypertension: Secondary | ICD-10-CM

## 2017-07-01 ENCOUNTER — Encounter: Payer: Self-pay | Admitting: *Deleted

## 2017-07-19 ENCOUNTER — Encounter: Payer: Medicare Other | Admitting: Internal Medicine

## 2017-08-20 ENCOUNTER — Telehealth: Payer: Self-pay | Admitting: Internal Medicine

## 2017-08-20 NOTE — Telephone Encounter (Signed)
Pt called to request medication for gout.   Pt decided to make an appt to be seen tomorrow 08/21/2017 with the [facility].

## 2017-08-21 ENCOUNTER — Encounter: Payer: Self-pay | Admitting: Internal Medicine

## 2017-08-21 ENCOUNTER — Ambulatory Visit (INDEPENDENT_AMBULATORY_CARE_PROVIDER_SITE_OTHER): Payer: Medicare Other | Admitting: Internal Medicine

## 2017-08-21 ENCOUNTER — Encounter (INDEPENDENT_AMBULATORY_CARE_PROVIDER_SITE_OTHER): Payer: Self-pay

## 2017-08-21 VITALS — BP 149/71 | HR 78 | Temp 98.6°F | Ht 68.0 in | Wt 185.4 lb

## 2017-08-21 DIAGNOSIS — Z87891 Personal history of nicotine dependence: Secondary | ICD-10-CM

## 2017-08-21 DIAGNOSIS — I130 Hypertensive heart and chronic kidney disease with heart failure and stage 1 through stage 4 chronic kidney disease, or unspecified chronic kidney disease: Secondary | ICD-10-CM

## 2017-08-21 DIAGNOSIS — N183 Chronic kidney disease, stage 3 (moderate): Secondary | ICD-10-CM | POA: Diagnosis not present

## 2017-08-21 DIAGNOSIS — E039 Hypothyroidism, unspecified: Secondary | ICD-10-CM | POA: Diagnosis not present

## 2017-08-21 DIAGNOSIS — Z79899 Other long term (current) drug therapy: Secondary | ICD-10-CM | POA: Diagnosis not present

## 2017-08-21 DIAGNOSIS — M10271 Drug-induced gout, right ankle and foot: Secondary | ICD-10-CM

## 2017-08-21 DIAGNOSIS — M109 Gout, unspecified: Secondary | ICD-10-CM

## 2017-08-21 DIAGNOSIS — I5030 Unspecified diastolic (congestive) heart failure: Secondary | ICD-10-CM | POA: Diagnosis not present

## 2017-08-21 MED ORDER — ALLOPURINOL 100 MG PO TABS
50.0000 mg | ORAL_TABLET | Freq: Every day | ORAL | 1 refills | Status: DC
Start: 2017-08-21 — End: 2017-10-10

## 2017-08-21 MED ORDER — COLCHICINE 0.6 MG PO TABS: 0.6000 mg | ORAL_TABLET | Freq: Every day | ORAL | 2 refills | Status: DC

## 2017-08-21 MED ORDER — PREDNISONE 10 MG PO TABS: ORAL_TABLET | ORAL | 0 refills | Status: AC

## 2017-08-21 NOTE — Progress Notes (Signed)
   CC: Right foot pain  HPI:  Jill Golden is a 77 y.o. with hypertension, diastolic heart failure, hypothyroidism, ckd3, and gout who presents with right foot pain. Please see problem based charting for evaluation, assessment, and plan.   Past Medical History:  Diagnosis Date  . Anxiety   . BPPV (benign paroxysmal positional vertigo)   . CAD (coronary artery disease)    4 stents placed prior to CABAG (1996 ? ) , s/p CABG 1996   . Diabetes mellitus   . Diabetes mellitus, type 2 (HCC) 05/09/2010  . Dizziness   . Hypertension   . Hypothyroid    Review of Systems:   Right foot pain  Physical Exam:  Vitals:   08/21/17 1019  BP: (!) 149/71  Pulse: 78  Temp: 98.6 F (37 C)  TempSrc: Oral  SpO2: 100%  Weight: 185 lb 6.4 oz (84.1 kg)  Height: 5\' 8"  (1.727 m)    Physical Exam  Constitutional: She appears well-developed and well-nourished. No distress.  HENT:  Head: Normocephalic and atraumatic.  Eyes: Conjunctivae are normal.  Cardiovascular: Normal rate, regular rhythm and normal heart sounds.  Respiratory: Effort normal and breath sounds normal. No respiratory distress. She has no wheezes.  GI: Soft. She exhibits no distension. There is no tenderness.  Musculoskeletal: She exhibits tenderness (lateral aspect of right foot and over third phalange and metatarsal ).  Warmth to palpation and tenderness to touch over lateral aspect of right foot.   Neurological: She is alert.  Skin: She is not diaphoretic. There is erythema (dorsal aspect of right foot).  Psychiatric: She has a normal mood and affect. Her behavior is normal. Judgment and thought content normal.    Assessment & Plan:   See Encounters Tab for problem based charting.  Right foot gout attack The patient presented with a history of 6 day history of right foot pain. The patient states that the pain is severe in nature and constant. She has been using tylenol without any pain relief. She has been having  some swelling in her foot as well.  The patient doe not recall any recent seafood, red meat, or alcohol consumption.   The patient has a history of presumed gout in 2015 during which time the patient was prescribed allopurinol, however the patient did not pick up the medication as it was too expensive for her.   Assessment and plan  The patient's symptoms appear to be consistent with an acute gout flare in her right foot. The patient does not have any effusion that causes concern for infective arthritis or osteoarthritis.   The patient has ckd 3 and therefore opted to not put the patient on nsaids and rather prescribed a 8 day prednisone taper. The patient was also started on allopurinol 50mg  daily and colchicine 0.6mg  daily. The patient was informed that the allopurinol is [facility] the [facility]4-$6 list so she should be able to get it for an affordable price. The patient was also told to follow up in 2 weeks so that we could titrate her allopurinol dose to a uric acid level <0.6.   Patient discussed with Dr. Rogelia BogaButcher

## 2017-08-21 NOTE — Patient Instructions (Signed)
It was a pleasure to see you today Jill Golden. Please make the following changes:  -Please start taking allopurinol, colchicine, prednisone   If you have any questions or concerns, please call our clinic at [phone] between 9am-5pm and after hours call [phone] and ask for the internal medicine resident on call. If you feel you are having a medical emergency please call 911.   Thank you, we look forward to help you remain healthy!  [name], MD Internal Medicine PGY2   Gout Gout is painful swelling that can occur in some of your joints. Gout is a type of arthritis. This condition is caused by having too much uric acid in your body. Uric acid is a chemical that forms when your body breaks down substances called purines. Purines are important for building body proteins. When your body has too much uric acid, sharp crystals can form and build up inside your joints. This causes pain and swelling. Gout attacks can happen quickly and be very painful (acute gout). Over time, the attacks can affect more joints and become more frequent (chronic gout). Gout can also cause uric acid to build up under your skin and inside your kidneys. What are the causes? This condition is caused by too much uric acid in your blood. This can occur because:  Your kidneys do not remove enough uric acid from your blood. This is the most common cause.  Your body makes too much uric acid. This can occur with some cancers and cancer treatments. It can also occur if your body is breaking down too many red blood cells (hemolytic anemia).  You eat too many foods that are high in purines. These foods include organ meats and some seafood. Alcohol, especially beer, is also high in purines.  A gout attack may be triggered by trauma or stress. What increases the risk? This condition is more likely to develop in people who:  Have a family history of gout.  Are female and middle-aged.  Are female and have gone  through menopause.  Are obese.  Frequently drink alcohol, especially beer.  Are dehydrated.  Lose weight too quickly.  Have an organ transplant.  Have lead poisoning.  Take certain medicines, including aspirin, cyclosporine, diuretics, levodopa, and niacin.  Have kidney disease or psoriasis.  What are the signs or symptoms? An attack of acute gout happens quickly. It usually occurs in just one joint. The most common place is the big toe. Attacks often start at night. Other joints that may be affected include joints of the feet, ankle, knee, fingers, wrist, or elbow. Symptoms may include:  Severe pain.  Warmth.  Swelling.  Stiffness.  Tenderness. The affected joint may be very painful to touch.  Shiny, red, or purple skin.  Chills and fever.  Chronic gout may cause symptoms more frequently. More joints may be involved. You may also have white or yellow lumps (tophi) on your hands or feet or in other areas near your joints. How is this diagnosed? This condition is diagnosed based on your symptoms, medical history, and physical exam. You may have tests, such as:  Blood tests to measure uric acid levels.  Removal of joint fluid with a needle (aspiration) to look for uric acid crystals.  X-rays to look for joint damage.  How is this treated? Treatment for this condition has two phases: treating an acute attack and preventing future attacks. Acute gout treatment may include medicines to reduce pain and swelling, including:  NSAIDs.  Steroids. These  are strong anti-inflammatory medicines that can be taken by mouth (orally) or injected into a joint.  Colchicine. This medicine relieves pain and swelling when it is taken soon after an attack. It can be given orally or through an IV tube.  Preventive treatment may include:  Daily use of smaller doses of NSAIDs or colchicine.  Use of a medicine that reduces uric acid levels in your blood.  Changes to your diet. You may  need to see a specialist about healthy eating (dietitian).  Follow these instructions at home: During a Gout Attack  If directed, apply ice to the affected area: ? Put ice in a plastic bag. ? Place a towel between your skin and the bag. ? Leave the ice on for 20 minutes, 2-3 times a day.  Rest the joint as much as possible. If the affected joint is in your leg, you may be given crutches to use.  Raise (elevate) the affected joint above the level of your heart as often as possible.  Drink enough fluids to keep your urine clear or pale yellow.  Take over-the-counter and prescription medicines only as told by your health care provider.  Do not drive or operate heavy machinery while taking prescription pain medicine.  Follow instructions from your health care provider about eating or drinking restrictions.  Return to your normal activities as told by your health care provider. Ask your health care provider what activities are safe for you. Avoiding Future Gout Attacks  Follow a low-purine diet as told by your dietitian or health care provider. Avoid foods and drinks that are high in purines, including liver, kidney, anchovies, asparagus, herring, mushrooms, mussels, and beer.  Limit alcohol intake to no more than 1 drink a day for nonpregnant women and 2 drinks a day for men. One drink equals 12 oz of beer, 5 oz of wine, or 1 oz of hard liquor.  Maintain a healthy weight or lose weight if you are overweight. If you want to lose weight, talk with your health care provider. It is important that you do not lose weight too quickly.  Start or maintain an exercise program as told by your health care provider.  Drink enough fluids to keep your urine clear or pale yellow.  Take over-the-counter and prescription medicines only as told by your health care provider.  Keep all follow-up visits as told by your health care provider. This is important. Contact a health care provider if:  You  have another gout attack.  You continue to have symptoms of a gout attack after10 days of treatment.  You have side effects from your medicines.  You have chills or a fever.  You have burning pain when you urinate.  You have pain in your lower back or belly. Get help right away if:  You have severe or uncontrolled pain.  You cannot urinate. This information is not intended to replace advice given to you by your health care provider. Make sure you discuss any questions you have with your health care provider. Document Released: 12/24/1999 Document Revised: 06/03/2015 Document Reviewed: 10/08/2014 Elsevier Interactive Patient Education  [employer]2018 Elsevier Inc.

## 2017-08-21 NOTE — Assessment & Plan Note (Signed)
The patient presented with a history of 6 day history of right foot pain. The patient states that the pain is severe in nature and constant. She has been using tylenol without any pain relief. She has been having some swelling in her foot as well.  The patient doe not recall any recent seafood, red meat, or alcohol consumption.   The patient has a history of presumed gout in 2015 during which time the patient was prescribed allopurinol, however the patient did not pick up the medication as it was too expensive for her.   Assessment and plan  The patient's symptoms appear to be consistent with an acute gout flare in her right foot. The patient does not have any effusion that causes concern for infective arthritis or osteoarthritis.   The patient has ckd 3 and therefore opted to not put the patient on nsaids and rather prescribed a 8 day prednisone taper. The patient was also started on allopurinol 50mg  daily and colchicine 0.6mg  daily. The patient was informed that the [facility]opurinol is [facility] the [facility] list so she should be able to get it for an affordable price. The patient was also told to follow up in 2 weeks so that we could titrate her allopurinol dose to a uric acid level <0.6.

## 2017-08-22 NOTE — Progress Notes (Signed)
Internal Medicine Clinic Attending  Case discussed with Dr. Delma Officerhundi at the time of the visit.  We reviewed the resident's history and exam and pertinent patient test results.  I agree with the assessment, diagnosis, and plan of care documented in the resident's note. The pt will need a minimum of 6 months of colchicine prophylaxis after initiation of allopurinol.

## 2017-09-04 ENCOUNTER — Ambulatory Visit: Payer: Medicare Other

## 2017-09-27 ENCOUNTER — Telehealth: Payer: Self-pay | Admitting: Internal Medicine

## 2017-10-01 ENCOUNTER — Other Ambulatory Visit: Payer: Self-pay | Admitting: Internal Medicine

## 2017-10-01 DIAGNOSIS — E038 Other specified hypothyroidism: Secondary | ICD-10-CM

## 2017-10-01 NOTE — Telephone Encounter (Signed)
Hi,  I see that last TSH has been 2 years ago and last visit 6 months ago. It will be better if she can come for blood test +/- visit to make sure it is safe to give her the previous dose of Levothyroxine.  Thanks [employer]Ellie

## 2017-10-02 NOTE — Telephone Encounter (Signed)
Attempted to reach patient to schedule appt to meet new PCP and have TSH drawn. No answer. Message left on VM requesting return call. Kinnie FeilL. Ducatte, RN, BSN

## 2017-10-02 NOTE — Telephone Encounter (Signed)
Thank you! Hope we hear from her.  Ellie

## 2017-10-08 ENCOUNTER — Encounter (INDEPENDENT_AMBULATORY_CARE_PROVIDER_SITE_OTHER): Payer: Self-pay

## 2017-10-08 ENCOUNTER — Ambulatory Visit (INDEPENDENT_AMBULATORY_CARE_PROVIDER_SITE_OTHER): Payer: Medicare Other | Admitting: Internal Medicine

## 2017-10-08 ENCOUNTER — Other Ambulatory Visit: Payer: Self-pay

## 2017-10-08 ENCOUNTER — Encounter: Payer: Self-pay | Admitting: Internal Medicine

## 2017-10-08 VITALS — BP 138/70 | HR 56 | Temp 98.2°F | Ht 68.0 in | Wt 183.3 lb

## 2017-10-08 DIAGNOSIS — Z87891 Personal history of nicotine dependence: Secondary | ICD-10-CM

## 2017-10-08 DIAGNOSIS — M109 Gout, unspecified: Secondary | ICD-10-CM

## 2017-10-08 DIAGNOSIS — I10 Essential (primary) hypertension: Secondary | ICD-10-CM | POA: Diagnosis not present

## 2017-10-08 DIAGNOSIS — Z79899 Other long term (current) drug therapy: Secondary | ICD-10-CM | POA: Diagnosis not present

## 2017-10-08 DIAGNOSIS — E039 Hypothyroidism, unspecified: Secondary | ICD-10-CM

## 2017-10-08 DIAGNOSIS — M10271 Drug-induced gout, right ankle and foot: Secondary | ICD-10-CM

## 2017-10-08 DIAGNOSIS — Z79890 Hormone replacement therapy (postmenopausal): Secondary | ICD-10-CM

## 2017-10-08 DIAGNOSIS — N2581 Secondary hyperparathyroidism of renal origin: Secondary | ICD-10-CM

## 2017-10-08 DIAGNOSIS — R011 Cardiac murmur, unspecified: Secondary | ICD-10-CM

## 2017-10-08 MED ORDER — LEVOTHYROXINE SODIUM 50 MCG PO TABS: 50.0000 ug | ORAL_TABLET | Freq: Every day | ORAL | 3 refills | Status: DC

## 2017-10-08 NOTE — Assessment & Plan Note (Signed)
Patient was supposed to be on allopurinol 50 mg daily and colchicine 0.6 mg daily.  However she mentions that she has not been taking those because she thought those are as needed medications and only for the time that she has pain.  She has been symptom-free and denies any flareup.  Joint exams are normal. No uric acid in chart.  -Check uric acid -Amended to take allopurinol 50 mg daily and colchicine 0.6 mg daily.  (These meds can be adjusted after having uric acid result)

## 2017-10-08 NOTE — Patient Instructions (Addendum)
It was our pleasure taking care of you in our clinic today.  You were seen for medication refill and blood work. I will send you to check your thyroid hormone and Uric acid and some basic lab test. For now, please continue your medications as before. We will call you about the results or if we need to change your medications does. Your vital signs and physical exams were normal today. Please pick up your medications that I refilled and take all of your medications as instructed. Please contact us if you have any question or concern.   Please come back to clinic in 3-6 months or earlier as needed.  Thanks, Dr. Teola Bradley

## 2017-10-08 NOTE — Assessment & Plan Note (Signed)
Patient has been on Synthroid 50 mcg qd. Denies any hypo or hyperthyroidism symptoms (No change in weight, no cold or cold intolerance, no hair loss, no fatigue.Marland Kitchen)  -TSH -Refilled Synthroid 50 mcg QD (will change the dose as needed per TSH result)

## 2017-10-08 NOTE — Assessment & Plan Note (Signed)
BP was 1155/74--> Repeat: 138/70 The patient is currently taking Triamterene 37.5-25 QD  . His last blood pressure visits are  BP Readings from Last 3 Encounters:  10/08/17 138/70  08/21/17 (!) 149/71  12/22/16 130/76   Plan -Continue current dose of Triamterene-HCTZ 37.5-25 mg QD -BMP today -Follow up in 3 months

## 2017-10-08 NOTE — Assessment & Plan Note (Signed)
BMP today

## 2017-10-08 NOTE — Progress Notes (Signed)
   CC: Meds refill and hypothyroidism follow-up  HPI:  [name] is a 77 y.o. female with past medical history as listed below, is here in clinic due to hypothyroidism follow-up and medications refill.  Please see problem based charting for further details and assessment and plan.  Past Medical History:  Diagnosis Date  . Anxiety   . BPPV (benign paroxysmal positional vertigo)   . CAD (coronary artery disease)    4 stents placed prior to CABAG (1996 ? ) , s/p CABG 1996   . Diabetes mellitus   . Diabetes mellitus, type 2 (HCC) 05/09/2010  . Dizziness   . Hypertension   . Hypothyroid   Family history: Cancer in mother Oropharyngeal cancer in brother  Social history: Former smoker (quit over 20 years ago) No alcohol No drug use  Review of Systems:  Review of Systems  Constitutional: Negative for fever.  Respiratory: Negative for cough.   Cardiovascular: Negative for chest pain, palpitations and leg swelling.  Gastrointestinal: Negative for abdominal pain, constipation, diarrhea, nausea and vomiting.  Musculoskeletal: Negative for joint pain.  Neurological: Negative for dizziness and headaches.    Physical Exam:  Vitals:   10/08/17 1311 10/08/17 1416  BP: (!) 155/74 138/70  Pulse: 64 (!) 56  Temp: 98.2 F (36.8 C)   TempSrc: Oral   SpO2: 100% 100%  Weight: 183 lb 4.8 oz (83.1 kg)   Height: 5\' 8"  (1.727 m)    Physical Exam  Constitutional: She is oriented to person, place, and time. She appears well-developed and well-nourished. No distress.  Eyes: EOM are normal.  Cardiovascular: Normal rate and regular rhythm.  Soft II/VI systolic Murmur heard. Pulmonary/Chest: Effort normal and breath sounds normal. No respiratory distress. She has no wheezes. She has no rales.  Abdominal: Soft. Bowel sounds are normal. There is no tenderness.  Musculoskeletal: She exhibits no edema or tenderness.  Neurological: She is alert and oriented to person, place, and time.    Psychiatric: She has a normal mood and affect.    Assessment & Plan:   See Encounters Tab for problem based charting.  Patient seen with Dr. Cleda Daub

## 2017-10-09 LAB — BMP8+ANION GAP
Anion Gap: 13 mmol/L (ref 10.0–18.0)
BUN / CREAT RATIO: 10 — AB (ref 12–28)
BUN: 12 mg/dL (ref 8–27)
CHLORIDE: 105 mmol/L (ref 96–106)
CO2: 22 mmol/L (ref 20–29)
Calcium: 9.6 mg/dL (ref 8.7–10.3)
Creatinine, Ser: 1.16 mg/dL — ABNORMAL HIGH (ref 0.57–1.00)
GFR calc Af Amer: 52 mL/min/{1.73_m2} — ABNORMAL LOW (ref >59)
GFR calc non Af Amer: 45 mL/min/{1.73_m2} — ABNORMAL LOW (ref >59)
GLUCOSE: 89 mg/dL (ref 65–99)
Potassium: 3.9 mmol/L (ref 3.5–5.2)
SODIUM: 140 mmol/L (ref 134–144)

## 2017-10-09 LAB — URIC ACID: Uric Acid: 7.8 mg/dL — ABNORMAL HIGH (ref 2.5–7.1)

## 2017-10-09 LAB — TSH: TSH: 2.45 u[IU]/mL (ref 0.450–4.500)

## 2017-10-09 NOTE — Progress Notes (Signed)
Internal Medicine Clinic Attending  I saw and evaluated the patient.  I personally confirmed the key portions of the history and exam documented by Dr. Maryla Morrow and I reviewed pertinent patient test results.  The assessment, diagnosis, and plan were formulated together and I agree with the documentation in the resident's note. TSH wnl, continue synthroid daily, Uric acid above goal of <6.  Increase allopurinol to 100mg  a day.

## 2017-10-10 ENCOUNTER — Other Ambulatory Visit: Payer: Self-pay | Admitting: Internal Medicine

## 2017-10-10 ENCOUNTER — Telehealth: Payer: Self-pay | Admitting: Internal Medicine

## 2017-10-10 MED ORDER — ALLOPURINOL 100 MG PO TABS: 100.0000 mg | ORAL_TABLET | Freq: Every day | ORAL | 2 refills | Status: DC

## 2017-10-10 NOTE — Telephone Encounter (Signed)
Called the patient and talked to her about her normal TSH and elevated Uric acid result. Talked to her about I  increase her Allopurinol to 100 mg QD (and recommended to continue her Levothyroxine 50 Mcgr daily). She understand the plan and agrees to it.

## 2017-11-15 ENCOUNTER — Telehealth: Payer: Self-pay | Admitting: Internal Medicine

## 2017-11-29 ENCOUNTER — Encounter: Payer: Medicare Other | Admitting: Internal Medicine

## 2018-01-11 ENCOUNTER — Other Ambulatory Visit: Payer: Self-pay | Admitting: *Deleted

## 2018-01-11 DIAGNOSIS — E785 Hyperlipidemia, unspecified: Secondary | ICD-10-CM

## 2018-01-11 DIAGNOSIS — I10 Essential (primary) hypertension: Secondary | ICD-10-CM

## 2018-01-11 MED ORDER — TRIAMTERENE-HCTZ 37.5-25 MG PO TABS: 1.0000 | ORAL_TABLET | Freq: Every day | ORAL | 1 refills | Status: DC

## 2018-01-11 MED ORDER — PRAVASTATIN SODIUM 40 MG PO TABS: 40.0000 mg | ORAL_TABLET | Freq: Every day | ORAL | 1 refills | Status: DC

## 2018-01-22 ENCOUNTER — Encounter (INDEPENDENT_AMBULATORY_CARE_PROVIDER_SITE_OTHER): Payer: Self-pay

## 2018-01-22 ENCOUNTER — Other Ambulatory Visit: Payer: Self-pay

## 2018-01-22 ENCOUNTER — Ambulatory Visit (INDEPENDENT_AMBULATORY_CARE_PROVIDER_SITE_OTHER): Payer: Medicare Other | Admitting: Internal Medicine

## 2018-01-22 VITALS — BP 148/84 | HR 70 | Temp 97.7°F | Ht 68.0 in | Wt 183.7 lb

## 2018-01-22 DIAGNOSIS — R040 Epistaxis: Secondary | ICD-10-CM | POA: Diagnosis not present

## 2018-01-22 DIAGNOSIS — I10 Essential (primary) hypertension: Secondary | ICD-10-CM

## 2018-01-22 DIAGNOSIS — Z87891 Personal history of nicotine dependence: Secondary | ICD-10-CM

## 2018-01-22 DIAGNOSIS — Z79899 Other long term (current) drug therapy: Secondary | ICD-10-CM | POA: Diagnosis not present

## 2018-01-22 DIAGNOSIS — M10271 Drug-induced gout, right ankle and foot: Secondary | ICD-10-CM

## 2018-01-22 DIAGNOSIS — M109 Gout, unspecified: Secondary | ICD-10-CM

## 2018-01-22 MED ORDER — COLCHICINE 0.6 MG PO TABS: 0.6000 mg | ORAL_TABLET | Freq: Every day | ORAL | 2 refills | Status: DC

## 2018-01-22 MED ORDER — LOSARTAN POTASSIUM 25 MG PO TABS: 25.0000 mg | ORAL_TABLET | Freq: Every day | ORAL | 0 refills | Status: DC

## 2018-01-22 MED ORDER — ALLOPURINOL 100 MG PO TABS: 100.0000 mg | ORAL_TABLET | Freq: Every day | ORAL | 2 refills | Status: DC

## 2018-01-22 NOTE — Patient Instructions (Addendum)
Thank you for allowing [place] to provide your care today. Today we discussed your blood pressure, nose bleeding and gout medications. As you report, you had few drops of nose bleeding that stopped quickly. Your physical exam is reassuring and the bleeding could be due to dry mucus. If it repeated please return to clinic.  Today we made following changes to your medications: I stop Triamterene-HCTZ   Start Losartan 25 mg daily Continue rest of your medications. (I sent a refill for your gout medications).  Please follow-up in 2 weeks for blood pressure check.  Should you have any questions or concerns please call the internal medicine clinic at [phone].   Thanks Nosebleed, Adult A nosebleed is when blood comes out of the nose. Nosebleeds are common. Usually, they are not a sign of a serious condition. Nosebleeds can happen if a small blood vessel in your nose starts to bleed or if the lining of your nose (mucous membrane) cracks. They are commonly caused by:  Allergies.  Colds.  Picking your nose.  Blowing your nose too hard.  An injury from sticking an object into your nose or getting hit in the nose.  Dry or cold air. Less common causes of nosebleeds include:  Toxic fumes.  Something abnormal in the nose or in the air-filled spaces in the bones of the face (sinuses).  Growths in the nose, such as polyps.  Medicines or conditions that cause blood to clot slowly.  Certain illnesses or procedures that irritate or dry out the nasal passages. Follow these instructions at home: When you have a nosebleed:   Sit down and tilt your head slightly forward.  Use a clean towel or tissue to pinch your nostrils under the bony part of your nose. After 10 minutes, let go of your nose and see if bleeding starts again. Do not release pressure before that time. If there is still bleeding, repeat the pinching and holding for 10 minutes until the bleeding stops.  Do not place tissues or  gauze in the nose to stop bleeding.  Avoid lying down and avoid tilting your head backward. That may make blood collect in the throat and cause gagging or coughing.  Use a nasal spray decongestant to help with a nosebleed as told by your health care provider.  Do not use petroleum jelly or mineral oil in your nose. It can drip into your lungs. After a nosebleed:  Avoid blowing your nose or sniffing for a number of hours.  Avoid straining, lifting, or bending at the waist for several days. You may resume other normal activities as you are able.  Use saline spray or a humidifier as told by your health care provider.  Aspirinand blood thinners make bleeding more likely. If you are prescribed these medicines and you suffer from nosebleeds: ? Ask your health care provider if you should stop taking the medicines or if you should adjust the dose. ? Do not stop taking medicines that your health care provider has recommended unless told by your health care provider.  If your nosebleed was caused by dry mucous membranes, use over-the-counter saline nasal spray or gel. This will keep the mucous membranes moist and allow them to heal. If you must use a lubricant: ? Choose one that is water-soluble. ? Use only as much as you need and use it only as often as needed. ? Do not lie down until several hours after you use it. Contact a health care provider if:  You have  a fever.  You get nosebleeds often or more often than usual.  You bruise very easily.  You have a nosebleed from having something stuck in your nose.  You have bleeding in your mouth.  You vomit or cough up brown material.  You have a nosebleed after you start a new medicine. Get help right away if:  You have a nosebleed after a fall or a head injury.  Your nosebleed does not go away after 20 minutes.  You feel dizzy or weak.  You have unusual bleeding from other parts of your body.  You have unusual bruising on other  parts of your body.  You become sweaty.  You vomit blood. This information is not intended to replace advice given to you by your health care provider. Make sure you discuss any questions you have with your health care provider. Document Released: 10/05/2004 Document Revised: 05/02/2016 Document Reviewed: 07/13/2015 Elsevier Interactive Patient Education  2019 [employer].

## 2018-01-22 NOTE — Assessment & Plan Note (Signed)
Allopurinol increased to 100 mg daily after uric acid came back 7.6 last visit. Patient said that she has been taking allopurinol and colchicine until 1 month ago when she ran out of her prescription and did not pick them up due to cost.  She has not had any gout flare or joint pain in the past 24 months. -Switch Triamteren - HCTZ to losartan due to avoid probable worsening of gout by HCTZ -Refilled allopurinol and colchicine

## 2018-01-22 NOTE — Assessment & Plan Note (Addendum)
Jill Golden reports 1 episode of mild epistaxis  (few drops of blood that cleared in few seconds with no recurrency) 2 days ago. Denies any trauma.  No cough and shortness of breath or to be concern about vasculitis [place]). No systemic symptoms, No hemoptysis, no other evidence of bleeding to be concern about blood dyscrasias at this point but will consider if repeat. Nasal cavity is normal on exam without hemangioma or other visible abnormality.   Assessment and plan: Mild, self resolving. Can be due to dry nasal mucosa, less likely to be due to high BP but will monitor BP at home. Nothing to do at this point.  -Provided the information to patient for epistaxis prevention . -Recommended to return to clinic for work up if reoccurred.

## 2018-01-22 NOTE — Assessment & Plan Note (Addendum)
The patient's blood pressure during this visit was 148/84, The patient is currently taking Triamteren-HCTZ 37.5-25 mg QD.  She reports having an episode of epistaxis 2 days ago, made her nervous and when checked her blood pressure it was 180/90.  Repeated blood pressure 30 minutes an hour later was 130s/80s.  Denies any headache, dizziness, recurrent epistaxis.  His last blood pressure visits are  BP Readings from Last 3 Encounters:  01/22/18 (!) 148/84  10/08/17 138/70  08/21/17 (!) 149/71    Assessment and Plan: 1 episode of high blood pressure at home is probably due to stress.  Home BP otherwise has been between 130-140 s over 70s-80s  Considering history of gout and noncompliant to her allopurinol and colchicine, I stop hydrochlorothiazide combination pill and start him on losartan 25 mg daily. -Stop triamterene- HCTZ -Start losartan 25 mg daily -Return in clinic in 2 weeks for BP check

## 2018-01-22 NOTE — Progress Notes (Signed)
   CC: nose bleeding HPI:  [name] is a 78 y.o. female with past medical history listed below, came to the clinic today due to having an episode of epistaxis 2 days ago. Please refer to problem based charting for or further details and assessment and plan and status of chronic medical conditions.  Past Medical History:  Diagnosis Date  . Anxiety   . BPPV (benign paroxysmal positional vertigo)   . CAD (coronary artery disease)    4 stents placed prior to CABAG (1996 ? ) , s/p CABG 1996   . Diabetes mellitus   . Diabetes mellitus, type 2 (HCC) 05/09/2010  . Dizziness   . Hypertension   . Hypothyroid    Family Hx: HTN                                 Father Oropharyngeal cancer    Brother Lung Cancer                   Mother  Social Hx: Does not smoke currently (Quit 20 y ago) No EtoH No illicit drug use  Review of Systems: Review of Systems  Constitutional: Negative for chills, fever and weight loss.  HENT: Positive for nosebleeds. Negative for congestion, ear pain and sinus pain.   Eyes: Negative for blurred vision.  Respiratory: Negative for cough, hemoptysis, sputum production and shortness of breath.   Cardiovascular: Negative for chest pain, palpitations and leg swelling.  Gastrointestinal: Negative for abdominal pain, diarrhea, nausea and vomiting.  Genitourinary: Negative for dysuria, frequency, hematuria and urgency.  Musculoskeletal: Negative for joint pain.  Skin: Negative for rash.  Neurological: Negative for dizziness, tingling, tremors, sensory change, weakness and headaches.   Physical Exam:  Vitals:   01/22/18 0859  BP: (!) 148/84  Pulse: 70  Temp: 97.7 F (36.5 C)  TempSrc: Oral  SpO2: 100%  Weight: 183 lb 11.2 oz (83.3 kg)  Height: 5\' 8"  (1.727 m)   Physical Exam HENT:     Head: Normocephalic and atraumatic.     Nose: Nose normal.     Comments: Nasal cavity is normal on exam without hemangioma or other visible abnormality.  Eyes:    Extraocular Movements: Extraocular movements intact.  Cardiovascular:     Rate and Rhythm: Normal rate and regular rhythm.     Pulses: Normal pulses.     Heart sounds: Normal heart sounds.  Pulmonary:     Effort: Pulmonary effort is normal.     Breath sounds: Normal breath sounds. No wheezing or rales.  Abdominal:     Palpations: Abdomen is soft.     Tenderness: There is no abdominal tenderness.  Musculoskeletal:     Right lower leg: No edema.     Left lower leg: No edema.  Skin:    General: Skin is warm and dry.  Neurological:     General: No focal deficit present.     Mental Status: She is alert and oriented to person, place, and time.  Psychiatric:        Mood and Affect: Mood normal.        Behavior: Behavior normal.     Assessment & Plan:   See Encounters Tab for problem based charting.  Patient discussed with Dr. Sandre Kittyaines

## 2018-01-23 NOTE — Progress Notes (Signed)
Internal Medicine Clinic Attending  Case discussed with Dr. Masoudi at the time of the visit.  We reviewed the resident's history and exam and pertinent patient test results.  I agree with the assessment, diagnosis, and plan of care documented in the resident's note.  Alexander Raines, M.D., Ph.D.  

## 2018-03-11 ENCOUNTER — Ambulatory Visit: Payer: Medicare Other

## 2018-03-11 ENCOUNTER — Encounter: Payer: Self-pay | Admitting: Internal Medicine

## 2018-03-18 ENCOUNTER — Ambulatory Visit: Payer: Medicare Other

## 2018-03-19 ENCOUNTER — Ambulatory Visit: Payer: Medicare Other

## 2018-03-28 ENCOUNTER — Other Ambulatory Visit: Payer: Self-pay | Admitting: Pharmacist

## 2018-03-28 DIAGNOSIS — Z23 Encounter for immunization: Secondary | ICD-10-CM

## 2018-03-28 MED ORDER — INFLUENZA VAC SPLIT QUAD 0.5 ML IM SUSY: 0.5000 mL | PREFILLED_SYRINGE | Freq: Once | INTRAMUSCULAR | 0 refills | Status: DC

## 2018-05-14 ENCOUNTER — Other Ambulatory Visit: Payer: Self-pay | Admitting: Internal Medicine

## 2018-05-14 DIAGNOSIS — E039 Hypothyroidism, unspecified: Secondary | ICD-10-CM

## 2018-05-15 NOTE — Telephone Encounter (Signed)
levothyroxine (SYNTHROID, LEVOTHROID) 50 MCG tablet, refill request @  [address] [facility] [address] ([address] [place] - [address] [phone] (Phone) [phone] (Fax)

## 2018-07-04 ENCOUNTER — Other Ambulatory Visit: Payer: Self-pay

## 2018-07-04 ENCOUNTER — Ambulatory Visit (INDEPENDENT_AMBULATORY_CARE_PROVIDER_SITE_OTHER): Payer: Medicare Other | Admitting: Internal Medicine

## 2018-07-04 DIAGNOSIS — Z79899 Other long term (current) drug therapy: Secondary | ICD-10-CM | POA: Diagnosis not present

## 2018-07-04 DIAGNOSIS — Z7982 Long term (current) use of aspirin: Secondary | ICD-10-CM | POA: Diagnosis not present

## 2018-07-04 DIAGNOSIS — I10 Essential (primary) hypertension: Secondary | ICD-10-CM | POA: Diagnosis not present

## 2018-07-04 DIAGNOSIS — H8110 Benign paroxysmal vertigo, unspecified ear: Secondary | ICD-10-CM

## 2018-07-04 DIAGNOSIS — E785 Hyperlipidemia, unspecified: Secondary | ICD-10-CM

## 2018-07-04 DIAGNOSIS — Z79890 Hormone replacement therapy (postmenopausal): Secondary | ICD-10-CM | POA: Diagnosis not present

## 2018-07-04 DIAGNOSIS — Z87891 Personal history of nicotine dependence: Secondary | ICD-10-CM

## 2018-07-04 DIAGNOSIS — E039 Hypothyroidism, unspecified: Secondary | ICD-10-CM | POA: Diagnosis not present

## 2018-07-04 DIAGNOSIS — Z951 Presence of aortocoronary bypass graft: Secondary | ICD-10-CM

## 2018-07-04 MED ORDER — PRAVASTATIN SODIUM 40 MG PO TABS: 40.0000 mg | ORAL_TABLET | Freq: Every day | ORAL | 2 refills | Status: DC

## 2018-07-04 MED ORDER — MECLIZINE HCL 12.5 MG PO TABS: 12.5000 mg | ORAL_TABLET | Freq: Three times a day (TID) | ORAL | 1 refills | Status: DC | PRN

## 2018-07-04 MED ORDER — LEVOTHYROXINE SODIUM 50 MCG PO TABS: ORAL_TABLET | ORAL | 2 refills | Status: DC

## 2018-07-04 MED ORDER — LOSARTAN POTASSIUM 25 MG PO TABS: 25.0000 mg | ORAL_TABLET | Freq: Every day | ORAL | 2 refills | Status: DC

## 2018-07-04 NOTE — Assessment & Plan Note (Signed)
BP at home 137/78. (BP range recently 120-130 s). No headache, no dizziness. She is Losartan 25 mg QD regularly  -Refilled Losartan -BMP next visit -F/u in [facility] in 2 months

## 2018-07-04 NOTE — Assessment & Plan Note (Signed)
(  Televisit) Patient is doing great. No cardiac symptoms. -Sent refill for Paravastatin -Continue ASA 81 mg QD

## 2018-07-04 NOTE — Assessment & Plan Note (Signed)
(  Tele visit) Patient did not have any vertifo attack recently and doing well. -Sent refill for Meclizine to take when necessary

## 2018-07-04 NOTE — Assessment & Plan Note (Deleted)
(  Televisit) Patient with no cardiac symptoms. -Sent refill for Paravastatin

## 2018-07-04 NOTE — Progress Notes (Signed)
  [facility] Internal Medicine Residency Telephone Encounter Continuity Care Appointment  HPI:   This telephone encounter was created for Jill Golden on 07/04/2018 for the following purpose/cc HTN follow up.   Past Medical History:  Past Medical History:  Diagnosis Date  . Anxiety   . BPPV (benign paroxysmal positional vertigo)   . CAD (coronary artery disease)    4 stents placed prior to CABAG (1996 ? ) , s/p CABG 1996   . Diabetes mellitus   . Diabetes mellitus, type 2 ([place]) 05/09/2010  . Dizziness   . Hypertension   . Hypothyroid       ROS:      Assessment / Plan / Recommendations:   Please see A&P under problem oriented charting for assessment of the patient's acute and chronic medical conditions.   As always, pt is advised that if symptoms worsen or new symptoms arise, they should go to an urgent care facility or to to ER for further evaluation.   Consent and Medical Decision Making:   Patient discussed with Dr. Daryll Drown  This is a telephone encounter between Ismael Treptow and Gaylene Moylan on 07/04/2018 for HTN follow up. The visit was conducted with the patient located at home and [facility]. The patient's identity was confirmed using their DOB and current address. The patient has consented to being evaluated through a telephone encounter and understands the associated risks (an examination cannot be done and the patient may need to come in for an appointment) / benefits (allows the patient to remain at home, decreasing exposure to coronavirus). I personally spent 14 minutes on medical discussion.

## 2018-07-04 NOTE — Assessment & Plan Note (Signed)
(  Televisit) Patient denies any symptoms.  TSH checked last visit 10/08/2017, normal at 2.4.  -Send refill for Levothyroxine 50 mcg QD

## 2018-07-07 ENCOUNTER — Other Ambulatory Visit: Payer: Self-pay | Admitting: Internal Medicine

## 2018-07-07 DIAGNOSIS — I10 Essential (primary) hypertension: Secondary | ICD-10-CM

## 2018-07-08 ENCOUNTER — Other Ambulatory Visit: Payer: Self-pay

## 2018-07-08 NOTE — Telephone Encounter (Signed)
Called pt, explained the change from maxide to losartan- 01/22/2018- due to gout. Pt understands now and is happy

## 2018-07-08 NOTE — Telephone Encounter (Signed)
Requesting to speak with a nurse about meds. Please call pt back.  

## 2018-07-14 NOTE — Progress Notes (Signed)
Internal Medicine Clinic Attending  Case discussed with Dr. Masoudi at the time of the visit.  We reviewed the resident's history, telephone conversation and pertinent patient test results.  I agree with the assessment, diagnosis, and plan of care documented in the resident's note.   

## 2018-08-08 ENCOUNTER — Telehealth: Payer: Self-pay | Admitting: *Deleted

## 2018-08-08 NOTE — Telephone Encounter (Signed)
Call placed to patient on both numbers to offer AWV. No answer. Messages left on both VMs to return call to discuss. Hubbard Hartshorn, RN, BSN

## 2018-08-09 ENCOUNTER — Encounter: Payer: Medicare Other | Admitting: Internal Medicine

## 2018-08-09 ENCOUNTER — Other Ambulatory Visit: Payer: Self-pay

## 2018-08-09 ENCOUNTER — Encounter (INDEPENDENT_AMBULATORY_CARE_PROVIDER_SITE_OTHER): Payer: Self-pay

## 2018-08-12 DIAGNOSIS — Z961 Presence of intraocular lens: Secondary | ICD-10-CM | POA: Diagnosis not present

## 2018-08-12 DIAGNOSIS — H401132 Primary open-angle glaucoma, bilateral, moderate stage: Secondary | ICD-10-CM | POA: Diagnosis not present

## 2018-08-22 NOTE — Progress Notes (Signed)
This encounter was created in error - please disregard.

## 2018-09-13 DIAGNOSIS — H2512 Age-related nuclear cataract, left eye: Secondary | ICD-10-CM | POA: Diagnosis not present

## 2018-09-13 DIAGNOSIS — Z961 Presence of intraocular lens: Secondary | ICD-10-CM | POA: Diagnosis not present

## 2018-09-13 DIAGNOSIS — H25012 Cortical age-related cataract, left eye: Secondary | ICD-10-CM | POA: Diagnosis not present

## 2018-09-13 DIAGNOSIS — H401132 Primary open-angle glaucoma, bilateral, moderate stage: Secondary | ICD-10-CM | POA: Diagnosis not present

## 2018-09-13 DIAGNOSIS — H25042 Posterior subcapsular polar age-related cataract, left eye: Secondary | ICD-10-CM | POA: Diagnosis not present

## 2018-10-10 ENCOUNTER — Other Ambulatory Visit: Payer: Self-pay

## 2018-10-10 ENCOUNTER — Encounter (HOSPITAL_COMMUNITY): Payer: Self-pay | Admitting: Emergency Medicine

## 2018-10-10 ENCOUNTER — Emergency Department (HOSPITAL_COMMUNITY)
Admission: EM | Admit: 2018-10-10 | Discharge: 2018-10-10 | Disposition: A | Discharge status: Home / Self Care | Payer: Medicare Other | Attending: Emergency Medicine | Admitting: Emergency Medicine

## 2018-10-10 ENCOUNTER — Emergency Department (HOSPITAL_COMMUNITY): Payer: Medicare Other

## 2018-10-10 DIAGNOSIS — Z79899 Other long term (current) drug therapy: Secondary | ICD-10-CM | POA: Insufficient documentation

## 2018-10-10 DIAGNOSIS — Z951 Presence of aortocoronary bypass graft: Secondary | ICD-10-CM | POA: Diagnosis not present

## 2018-10-10 DIAGNOSIS — H538 Other visual disturbances: Secondary | ICD-10-CM | POA: Insufficient documentation

## 2018-10-10 DIAGNOSIS — I130 Hypertensive heart and chronic kidney disease with heart failure and stage 1 through stage 4 chronic kidney disease, or unspecified chronic kidney disease: Secondary | ICD-10-CM | POA: Diagnosis not present

## 2018-10-10 DIAGNOSIS — Z888 Allergy status to other drugs, medicaments and biological substances status: Secondary | ICD-10-CM | POA: Insufficient documentation

## 2018-10-10 DIAGNOSIS — I5032 Chronic diastolic (congestive) heart failure: Secondary | ICD-10-CM | POA: Diagnosis not present

## 2018-10-10 DIAGNOSIS — N183 Chronic kidney disease, stage 3 unspecified: Secondary | ICD-10-CM | POA: Insufficient documentation

## 2018-10-10 DIAGNOSIS — R519 Headache, unspecified: Secondary | ICD-10-CM | POA: Diagnosis not present

## 2018-10-10 DIAGNOSIS — I6782 Cerebral ischemia: Secondary | ICD-10-CM | POA: Insufficient documentation

## 2018-10-10 DIAGNOSIS — I10 Essential (primary) hypertension: Secondary | ICD-10-CM

## 2018-10-10 DIAGNOSIS — Z880 Allergy status to penicillin: Secondary | ICD-10-CM | POA: Diagnosis not present

## 2018-10-10 DIAGNOSIS — Z7982 Long term (current) use of aspirin: Secondary | ICD-10-CM | POA: Diagnosis not present

## 2018-10-10 DIAGNOSIS — E785 Hyperlipidemia, unspecified: Secondary | ICD-10-CM | POA: Diagnosis not present

## 2018-10-10 DIAGNOSIS — E1122 Type 2 diabetes mellitus with diabetic chronic kidney disease: Secondary | ICD-10-CM | POA: Diagnosis not present

## 2018-10-10 DIAGNOSIS — E039 Hypothyroidism, unspecified: Secondary | ICD-10-CM | POA: Insufficient documentation

## 2018-10-10 DIAGNOSIS — M109 Gout, unspecified: Secondary | ICD-10-CM | POA: Diagnosis not present

## 2018-10-10 DIAGNOSIS — Z87891 Personal history of nicotine dependence: Secondary | ICD-10-CM | POA: Diagnosis not present

## 2018-10-10 DIAGNOSIS — N1831 Chronic kidney disease, stage 3a: Secondary | ICD-10-CM | POA: Diagnosis not present

## 2018-10-10 DIAGNOSIS — I2510 Atherosclerotic heart disease of native coronary artery without angina pectoris: Secondary | ICD-10-CM | POA: Diagnosis not present

## 2018-10-10 DIAGNOSIS — H5713 Ocular pain, bilateral: Secondary | ICD-10-CM | POA: Diagnosis not present

## 2018-10-10 LAB — CBC WITH DIFFERENTIAL/PLATELET
Abs Immature Granulocytes: 0.07 10*3/uL (ref 0.00–0.07)
Basophils Absolute: 0 10*3/uL (ref 0.0–0.1)
Basophils Relative: 0 %
Eosinophils Absolute: 0.1 10*3/uL (ref 0.0–0.5)
Eosinophils Relative: 1 %
HCT: 39.7 % (ref 36.0–46.0)
Hemoglobin: 12.5 g/dL (ref 12.0–15.0)
Immature Granulocytes: 1 %
Lymphocytes Relative: 28 %
Lymphs Abs: 2.4 10*3/uL (ref 0.7–4.0)
MCH: 26.4 pg (ref 26.0–34.0)
MCHC: 31.5 g/dL (ref 30.0–36.0)
MCV: 83.9 fL (ref 80.0–100.0)
Monocytes Absolute: 0.5 10*3/uL (ref 0.1–1.0)
Monocytes Relative: 5 %
Neutro Abs: 5.7 10*3/uL (ref 1.7–7.7)
Neutrophils Relative %: 65 %
Platelets: 261 10*3/uL (ref 150–400)
RBC: 4.73 MIL/uL (ref 3.87–5.11)
RDW: 15.9 % — ABNORMAL HIGH (ref 11.5–15.5)
WBC: 8.7 10*3/uL (ref 4.0–10.5)
nRBC: 0 % (ref 0.0–0.2)

## 2018-10-10 LAB — BASIC METABOLIC PANEL
Anion gap: 8 (ref 5–15)
BUN: 15 mg/dL (ref 8–23)
CO2: 24 mmol/L (ref 22–32)
Calcium: 9.4 mg/dL (ref 8.9–10.3)
Chloride: 108 mmol/L (ref 98–111)
Creatinine, Ser: 0.93 mg/dL (ref 0.44–1.00)
GFR calc Af Amer: >60 mL/min (ref >60)
GFR calc non Af Amer: 58 mL/min — ABNORMAL LOW (ref >60)
Glucose, Bld: 112 mg/dL — ABNORMAL HIGH (ref 70–99)
Potassium: 3.4 mmol/L — ABNORMAL LOW (ref 3.5–5.1)
Sodium: 140 mmol/L (ref 135–145)

## 2018-10-10 MED ORDER — ACETAMINOPHEN 325 MG PO TABS
650.0000 mg | ORAL_TABLET | Freq: Once | ORAL | Status: AC
  Administered 2018-10-10: 10:00:00 650 mg via ORAL
  Filled 2018-10-10: qty 2

## 2018-10-10 MED ORDER — METOCLOPRAMIDE HCL 5 MG/ML IJ SOLN
10.0000 mg | Freq: Once | INTRAMUSCULAR | Status: AC
  Administered 2018-10-10: 10:00:00 10 mg via INTRAVENOUS
  Filled 2018-10-10: qty 2

## 2018-10-10 NOTE — ED Notes (Signed)
Patient transported to CT 

## 2018-10-10 NOTE — ED Triage Notes (Signed)
Pt reports that was supposed to have eye surgery this morning but BP was too high and advised to go to ED. Reports she took her HTN meds when leaving their office. Pt reports headache/eye pains that started about hour ago.

## 2018-10-10 NOTE — ED Notes (Signed)
Pt reports being scheduled for eye surgery this AM, was instructed not to take BP meds prior.  Pt reports taking BP meds after being advised to come to ED, appx 0815 today.  Pt reports hx of glaucoma, cataracts. C/o blurred vision, headached 7/10.  Denies CP, SHOB, n/v, diaphoresis.

## 2018-10-10 NOTE — ED Provider Notes (Signed)
[facility]-EMERGENCY DEPT Provider Note   CSN: [id number] Arrival date & time: 10/10/18  0808     History   Chief Complaint Chief Complaint  Patient presents with  . Hypertension  . Headache    HPI Jill Golden is a 78 y.o. female.     The history is provided by the patient and medical records. No language interpreter was used.  Hypertension Associated symptoms include headaches.  Headache  Jill Golden is a 78 y.o. female who presents to the Emergency Department complaining of HTN.  She was scheduled for cataract surgery today on the left eye.  When she went for the appointment they checked her blood pressure three times and it was too high (221/114) and she was referred to the ED for further evaluation.  She feels generally well but she does have mild headache and bilateral eye pain. She has mild blurred vision.  She noticed the headache today and is described as a mild heaviness. She has experienced similar sxs in the past.    She takes losartan 25 mg daily for HTN.  She has been on losartan for a few months (previously was on maxide).    She was instructed not to take any meds prior to surgery (aside from levothyroxine) so she did not take the losartan this morning.  At 815, after learning that her blood pressure was elevated she did take the losartan.    She has a hx/o HTN, CAD Eye doctor: Dr. Wynelle Link Past Medical History:  Diagnosis Date  . Anxiety   . BPPV (benign paroxysmal positional vertigo)   . CAD (coronary artery disease)    4 stents placed prior to CABAG (1996 ? ) , s/p CABG 1996   . Diabetes mellitus   . Diabetes mellitus, type 2 (HCC) 05/09/2010  . Dizziness   . Hypertension   . Hypothyroid     Patient Active Problem List   Diagnosis Date Noted  . Epistaxis 01/22/2018  . Right knee pain 12/22/2016  . Encounter for immunization 10/17/2014  . Need for vaccination with 13-polyvalent pneumococcal conjugate vaccine 10/17/2014   . Diastolic CHF (HCC) 10/17/2014  . 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 Counseling regarding advanced directives and goals of care 05/22/2014  . Glaucoma 05/19/2013  . CKD (chronic kidney disease) stage 3, GFR 30-59 ml/min 12/02/2012  . Insomnia 08/13/2012  . Gout 04/25/2012  . Hypothyroidism 04/26/2011  . Hyperlipidemia 01/24/2011  . Estrogen deficiency 01/24/2011  . Hypertension 05/09/2010  . 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 BPPV (benign paroxysmal positional vertigo) 05/09/2010  . Counseling on health promotion and disease prevention 05/09/2010    Past Surgical History:  Procedure Laterality Date  . ABDOMINAL HYSTERECTOMY    . APPENDECTOMY    . CHOLECYSTECTOMY    . CORONARY ARTERY BYPASS GRAFT    . TUBAL LIGATION       OB History   No obstetric history on file.      Home Medications    Prior to Admission medications   Medication Sig Start Date End Date Taking? Authorizing Provider  aspirin EC 81 MG tablet Take 81 mg by mouth daily.   Yes [provider]  dorzolamide-timolol (COSOPT) 22.3-6.8 MG/ML ophthalmic solution Place 1 drop into both eyes 2 (two) times daily. 08/12/18  Yes [provider]  levothyroxine (SYNTHROID) 50 MCG tablet TAKE 1 TABLET BY MOUTH ONCE DAILY BEFORE BREAKFAST 07/04/18  Yes Masoudi, Shawna Orleans, MD  losartan (COZAAR) 25 MG tablet Take 1 tablet (25 mg total) by mouth daily. 07/04/18  Yes Masoudi, Elhamalsadat, MD  pravastatin (PRAVACHOL) 40 MG tablet Take 1 tablet (40 mg total) by mouth daily. 07/04/18  Yes Masoudi, Elhamalsadat, MD  allopurinol (ZYLOPRIM) 100 MG tablet Take 1 tablet (100 mg total) by mouth daily. Patient not taking: Reported on 10/10/2018 01/22/18   Dewayne Hatch, MD  colchicine 0.6 MG tablet Take 1 tablet (0.6 mg total) by mouth daily. Patient not taking: Reported on 10/10/2018 01/22/18 01/22/19  Dewayne Hatch, MD  meclizine (ANTIVERT) 12.5 MG tablet  Take 1 tablet (12.5 mg total) by mouth 3 (three) times daily as needed for dizziness. Patient not taking: Reported on 10/10/2018 07/04/18   Dewayne Hatch, MD    Family History No family history on file.  Social History Social History   Tobacco Use  . Smoking status: Former Smoker    Quit date: 05/09/1994    Years since quitting: 24.4  . Smokeless tobacco: Never Used  Substance Use Topics  . Alcohol use: No    Alcohol/week: 0.0 standard drinks  . Drug use: No     Allergies   Atenolol, Lisinopril, and Penicillins   Review of Systems Review of Systems  Neurological: Positive for headaches.  All other systems reviewed and are negative.    Physical Exam Updated Vital Signs BP (!) 177/95   Pulse 71   Temp 98.2 F (36.8 C) (Oral)   Resp 14   LMP 05/09/1982   SpO2 99%   Physical Exam Vitals signs and nursing note reviewed.  Constitutional:      Appearance: She is well-developed.  HENT:     Head: Normocephalic and atraumatic.  Cardiovascular:     Rate and Rhythm: Normal rate and regular rhythm.     Heart sounds: No murmur.  Pulmonary:     Effort: Pulmonary effort is normal. No respiratory distress.     Breath sounds: Normal breath sounds.  Abdominal:     Palpations: Abdomen is soft.     Tenderness: There is no abdominal tenderness. There is no guarding or rebound.  Musculoskeletal:        General: No tenderness.  Skin:    General: Skin is warm and dry.  Neurological:     Mental Status: She is alert and oriented to person, place, and time.     Comments: Visual fields grossly intact.  No asymmetry of facial muscles.  5/5 strength in all four extremities with sensation to light touch intact in all four extremities.  No pronator drift.    Psychiatric:        Behavior: Behavior normal.      ED Treatments / Results  Labs (all labs ordered are listed, but only abnormal results are displayed) Labs Reviewed  BASIC METABOLIC PANEL - Abnormal; Notable for  the following components:      Result Value   Potassium 3.4 (*)    Glucose, Bld 112 (*)    GFR calc non Af Amer 58 (*)    All other components within normal limits  CBC WITH DIFFERENTIAL/PLATELET - Abnormal; Notable for the following components:   RDW 15.9 (*)    All other components within normal limits    EKG EKG Interpretation  Date/Time:  Thursday October 10 2018 09:32:30 EDT Ventricular Rate:  75 PR Interval:    QRS Duration: 106 QT Interval:  429 QTC Calculation: 480 R Axis:   7 Text Interpretation:  Sinus rhythm RSR' in V1  or V2, right VCD or RVH Left ventricular hypertrophy Borderline T abnormalities, anterior leads artifact present in leads I, II, III otherwise no significant change compared to prior, 05/26/14 Confirmed by Tilden Fossaees, Kynan Peasley [phone]([id number]) on 10/10/2018 9:38:09 AM   Radiology Ct Head Wo Contrast  Result Date: 10/10/2018 CLINICAL DATA:  Headache, blurred vision, hypertension. EXAM: CT HEAD WITHOUT CONTRAST TECHNIQUE: Contiguous axial images were obtained from the base of the skull through the vertex without intravenous contrast. COMPARISON:  05/26/2014 FINDINGS: Brain: There is atrophy and chronic small vessel disease changes. No acute intracranial abnormality. Specifically, no hemorrhage, hydrocephalus, mass lesion, acute infarction, or significant intracranial injury. Vascular: No hyperdense vessel or unexpected calcification. Skull: No acute calvarial abnormality. Sinuses/Orbits: Visualized paranasal sinuses and mastoids clear. Orbital soft tissues unremarkable. Other: None IMPRESSION: Atrophy, chronic microvascular disease. No acute intracranial abnormality. Electronically Signed   By: Charlett NoseKevin  Dover M.D.   On: 10/10/2018 10:19    Procedures Procedures (including critical care time)  Medications Ordered in ED Medications  acetaminophen (TYLENOL) tablet 650 mg (650 mg Oral Given 10/10/18 0956)  metoCLOPramide (REGLAN) injection 10 mg (10 mg Intravenous Given 10/10/18  0955)     Initial Impression / Assessment and Plan / ED Course  I have reviewed the triage vital signs and the nursing notes.  Pertinent labs & imaging results that were available during my care of the patient were reviewed by me and considered in my medical decision making (see chart for details).        Patient referred to the emergency department for evaluation of hypertension. She did miss her blood pressure medications due to surgery this morning but took them prior to ED arrival. On ED arrival she is hypotensive and complains of mild headache. She is non-toxic appearing on evaluation with no focal neurologic deficits. Following treatment with medications her headache has resolved and her blood pressure is improved. There is no evidence of and organ dysfunction at this time. Discussed with patient home care for hypertension and headache. Discussed continuing her current home medications. Return precautions discussed.  Final Clinical Impressions(s) / ED Diagnoses   Final diagnoses:  Bad headache  Essential hypertension    ED Discharge Orders    None       Tilden Fossaees, Keesha Pellum, MD 10/10/18 1434

## 2018-10-11 ENCOUNTER — Ambulatory Visit: Payer: Medicare Other

## 2018-10-14 ENCOUNTER — Ambulatory Visit (INDEPENDENT_AMBULATORY_CARE_PROVIDER_SITE_OTHER): Payer: Medicare Other | Admitting: Radiation Oncology

## 2018-10-14 ENCOUNTER — Other Ambulatory Visit: Payer: Self-pay

## 2018-10-14 ENCOUNTER — Encounter: Payer: Self-pay | Admitting: Radiation Oncology

## 2018-10-14 VITALS — BP 147/88 | HR 80 | Temp 98.0°F | Ht 68.0 in | Wt 179.0 lb

## 2018-10-14 DIAGNOSIS — H538 Other visual disturbances: Secondary | ICD-10-CM | POA: Diagnosis not present

## 2018-10-14 DIAGNOSIS — Z23 Encounter for immunization: Secondary | ICD-10-CM

## 2018-10-14 DIAGNOSIS — I10 Essential (primary) hypertension: Secondary | ICD-10-CM

## 2018-10-14 DIAGNOSIS — Z87891 Personal history of nicotine dependence: Secondary | ICD-10-CM

## 2018-10-14 DIAGNOSIS — Z79899 Other long term (current) drug therapy: Secondary | ICD-10-CM | POA: Diagnosis not present

## 2018-10-14 DIAGNOSIS — R519 Headache, unspecified: Secondary | ICD-10-CM

## 2018-10-14 DIAGNOSIS — Z0000 Encounter for general adult medical examination without abnormal findings: Secondary | ICD-10-CM

## 2018-10-14 MED ORDER — LOSARTAN POTASSIUM-HCTZ 50-12.5 MG PO TABS: 1.0000 | ORAL_TABLET | Freq: Every day | ORAL | 3 refills | Status: DC

## 2018-10-14 NOTE — Assessment & Plan Note (Addendum)
-  pt w/ hx of hypertension on 25 losartan daily with recent ED visit on 10/01 after BP of 221/114 noted at ophthalmologist where she was scheduled to undergo cataract surgery; she had not taken her home BP meds that day; she was symptomatic with a slight headache -unremarkable CBC and BMP at that time; CT head w/ atrophy and chronic microvascular changes but no acute intracranial abnormalities -BP today 191/90 --> 147/88 without chest pain, SOB, hematuria -patient reports a very mild headache associated with left eye blurriness from cataract  -patient needs additional antihypertensive medication -switching to losartan 50 w/ HCTZ 12.5 combo pill -BP recheck in 2-3 weeks with BMP

## 2018-10-14 NOTE — Assessment & Plan Note (Signed)
-   flu shot today

## 2018-10-14 NOTE — Progress Notes (Signed)
   CC: high blood pressure  HPI:  [name] is a 78 y.o. here for a blood pressure follow up.   Past Medical History:  Diagnosis Date  . Anxiety   . BPPV (benign paroxysmal positional vertigo)   . CAD (coronary artery disease)    4 stents placed prior to CABAG (1996 ? ) , s/p CABG 1996   . Diabetes mellitus   . Diabetes mellitus, type 2 ([place]) 05/09/2010  . Dizziness   . Hypertension   . Hypothyroid    Review of Systems:    Review of Systems  Constitutional: Negative for chills and fever.  Eyes: Positive for blurred vision (left eye).  Respiratory: Negative for shortness of breath.   Cardiovascular: Negative for chest pain.  Gastrointestinal: Negative for abdominal pain.  Genitourinary: Negative for hematuria.  Neurological: Positive for headaches. Tingling: left frontotemporal headache    Physical Exam:  There were no vitals filed for this visit.  Physical Exam  Constitutional: She is oriented to person, place, and time and well-developed, well-nourished, and in no distress. No distress.  Cardiovascular: Normal rate, regular rhythm, normal heart sounds and intact distal pulses.  No murmur heard. Pulmonary/Chest: Effort normal and breath sounds normal. No respiratory distress.  Abdominal: Soft. Bowel sounds are normal. She exhibits no distension.  Neurological: She is alert and oriented to person, place, and time.  Skin: Skin is warm and dry. She is not diaphoretic.  Psychiatric: Affect normal.    Assessment & Plan:   See Encounters Tab for problem based charting.  Patient seen with Dr. Dareen Piano

## 2018-10-14 NOTE — Patient Instructions (Addendum)
Thank you for coming to your appointment. It was so nice to see you. Today we discussed:  Hypertension -     Please STOP taking losartan 25 mg daily and START taking losartan-hydrochlorothiazide (HYZAAR) 50-12.5 MG tablet; Take 1 tablet by mouth daily. -     Continue taking daily blood pressures -     Follow up in 2-3 weeks for a blood pressure check with lab work   I look forward to seeing you again soon at your follow up!  Sincerely,  Al Decant, MD

## 2018-10-15 NOTE — Addendum Note (Signed)
Addended by: Elspeth Blucher M on: 10/15/2018 02:44 PM   Modules accepted: Level of Service  

## 2018-10-15 NOTE — Progress Notes (Signed)
Internal Medicine Clinic Attending  I saw and evaluated the patient.  I personally confirmed the key portions of the history and exam documented by Dr. Lanier and I reviewed pertinent patient test results.  The assessment, diagnosis, and plan were formulated together and I agree with the documentation in the resident's note.   

## 2018-10-24 ENCOUNTER — Encounter: Payer: Medicare Other | Admitting: Internal Medicine

## 2018-10-29 ENCOUNTER — Telehealth: Payer: Self-pay | Admitting: Dietician

## 2018-10-29 NOTE — Telephone Encounter (Signed)
Jill Golden left a message about sodium in broth vs stock. I returned call and answered her questions

## 2018-11-11 ENCOUNTER — Other Ambulatory Visit: Payer: Self-pay

## 2018-11-11 ENCOUNTER — Encounter: Payer: Self-pay | Admitting: Internal Medicine

## 2018-11-11 ENCOUNTER — Ambulatory Visit (INDEPENDENT_AMBULATORY_CARE_PROVIDER_SITE_OTHER): Payer: Medicare Other | Admitting: Internal Medicine

## 2018-11-11 VITALS — BP 190/92 | HR 81 | Temp 98.1°F | Ht 68.0 in | Wt 180.2 lb

## 2018-11-11 DIAGNOSIS — M109 Gout, unspecified: Secondary | ICD-10-CM

## 2018-11-11 DIAGNOSIS — I10 Essential (primary) hypertension: Secondary | ICD-10-CM

## 2018-11-11 DIAGNOSIS — M10271 Drug-induced gout, right ankle and foot: Secondary | ICD-10-CM | POA: Diagnosis not present

## 2018-11-11 MED ORDER — LOSARTAN POTASSIUM 100 MG PO TABS: 100.0000 mg | ORAL_TABLET | Freq: Every day | ORAL | 3 refills | Status: DC

## 2018-11-11 MED ORDER — ALLOPURINOL 100 MG PO TABS: 100.0000 mg | ORAL_TABLET | Freq: Every day | ORAL | 2 refills | Status: DC

## 2018-11-11 MED ORDER — AMLODIPINE BESYLATE 5 MG PO TABS: 5.0000 mg | ORAL_TABLET | Freq: Every day | ORAL | 3 refills | Status: DC

## 2018-11-11 MED ORDER — COLCHICINE 0.6 MG PO TABS: 0.6000 mg | ORAL_TABLET | Freq: Every day | ORAL | 2 refills | Status: DC

## 2018-11-11 NOTE — Assessment & Plan Note (Signed)
Patient has not taken Allopurinol and colchicine for a while because she did not think she needs it when not having any attack. Explained to her that her last uric acid level was elevated. She agrees to continue allopurinol and (likley short term) colchicine. We may discontinue those if uric acid normalized and if no further attack.  -Sending refills for allopurinol and colchicine -Stopping HCTZ (has started on prior visit) -Uric acid level today

## 2018-11-11 NOTE — Patient Instructions (Signed)
It was our pleasure taking care of you in our clinic today.  You were seen for follow up of your high blood pressure. Your blood pressure is significantly high at 190/92 again today. We need to make a better control. Please: 1-  start taking Amlodipine 5 mg tablet, once a day 2- Start Losartan 100 mg tablet once a day 3- Stop taking Losartan-HCTZ 4-Pick up the refill for Allopurinol and colchicine and take them as instrcucted 5-Come back to clinic in a week for blood pressure check 6- We will do some blood work today and will call you if the result will be abnormal 7- Call us for any question or concern 8- As always, if having any symptoms such as chest pain, shortness of breath, dizziness, new blurred vision, weakness, numbness or tingling of your limb, please go to emergency room as soon as possible.  Thank you

## 2018-11-11 NOTE — Progress Notes (Signed)
   CC: HTN f/u  HPI:  [name] is a 78 y.o. female with PMHx as documented below, presented for HTN f/u. Please refer to problem based charting for further details and assessment of plan of current problem and chronic medical conditions.   Past Medical History:  Diagnosis Date  . Anxiety   . BPPV (benign paroxysmal positional vertigo)   . CAD (coronary artery disease)    4 stents placed prior to CABAG (1996 ? ) , s/p CABG 1996   . Diabetes mellitus   . Diabetes mellitus, type 2 ([place]) 05/09/2010  . Dizziness   . Hypertension   . Hypothyroid    Review of Systems:  Review of Systems  Respiratory: Negative for cough and shortness of breath.   Cardiovascular: Negative for chest pain.  Gastrointestinal: Negative for abdominal pain, nausea and vomiting.  Neurological: Negative for dizziness, sensory change, weakness and headaches.   Physical Exam:  Vitals:   11/11/18 1316 11/11/18 1326  BP: (!) 203/93 (!) 190/92  Pulse: 83 81  Temp: 98.1 F (36.7 C)   TempSrc: Oral   SpO2: 100%   Weight: 180 lb 3.2 oz (81.7 kg)   Height: 5\' 8"  (1.727 m)    Physical Exam Constitutional:      General: She is not in acute distress.    Appearance: Normal appearance. She is not ill-appearing.  HENT:     Head: Normocephalic and atraumatic.  Cardiovascular:     Rate and Rhythm: Normal rate and regular rhythm.     Pulses: Normal pulses.     Heart sounds: Normal heart sounds. No murmur.  Pulmonary:     Effort: Pulmonary effort is normal. No respiratory distress.     Breath sounds: Normal breath sounds. No wheezing or rales.  Abdominal:     General: There is no distension.     Palpations: Abdomen is soft.     Tenderness: There is no abdominal tenderness.  Musculoskeletal:        General: No swelling.     Right lower leg: No edema.     Left lower leg: No edema.  Skin:    General: Skin is warm and dry.  Neurological:     General: No focal deficit present.     Mental Status: She  is alert and oriented to person, place, and time.  Psychiatric:        Mood and Affect: Mood normal.        Behavior: Behavior normal.     Assessment & Plan:   See Encounters Tab for problem based charting.  Patient discussed with Dr. Dareen Piano

## 2018-11-11 NOTE — Assessment & Plan Note (Addendum)
The patient's blood pressure during this visit was 203/93! Repeated Bp was 190/92. She denies any symptoms.  She mentions that her BP at home is sometimes up to 190/90. She does not know what the BP goal is for her when I ask her. The patient is currently taking Losartan-HCTZ 50-12.5 mg QD. (Replaced Losartan with that last time after elevated Bp and with Hx of ED visit 10/1 for HTN urgency.)  Assessment and Plan: Significantly uncontrolled BP. No evidence of hypertensive urgency and patient is asymptomatic at this point but she will need a close follow up. Adjusting antihypertensive meds as below:   -DC Losartan-HCTZ 50-12.5 mg QD given Hx of gout and elevated uric acid last time and also because patient has not taken Allopurinol for a while - Losartan to 100 mg QD -Adding Amlodipine 5 mg QD -f/u in clinic in 1 week or sooner if developed any symptoms or if BP remained elevated at home. (Provided all ED visit precautions) -Will need close follow up of BP in clinic, every week until achieving BP control -BMP today

## 2018-11-12 LAB — URIC ACID: Uric Acid: 6.8 mg/dL (ref 2.5–7.1)

## 2018-11-12 NOTE — Progress Notes (Signed)
Internal Medicine Clinic Attending  Case discussed with Dr. Masoudi  at the time of the visit.  We reviewed the resident's history and exam and pertinent patient test results.  I agree with the assessment, diagnosis, and plan of care documented in the resident's note.  

## 2018-11-18 ENCOUNTER — Encounter: Payer: Medicare Other | Admitting: Internal Medicine

## 2018-11-20 ENCOUNTER — Other Ambulatory Visit: Payer: Self-pay

## 2018-11-20 ENCOUNTER — Ambulatory Visit: Payer: Medicare Other | Admitting: Internal Medicine

## 2018-11-21 ENCOUNTER — Encounter: Payer: Self-pay | Admitting: Internal Medicine

## 2018-11-21 ENCOUNTER — Encounter: Payer: Medicare Other | Admitting: Internal Medicine

## 2018-12-13 ENCOUNTER — Ambulatory Visit: Payer: Medicare Other | Admitting: Internal Medicine

## 2018-12-13 ENCOUNTER — Other Ambulatory Visit: Payer: Self-pay

## 2019-01-04 ENCOUNTER — Other Ambulatory Visit: Payer: Self-pay | Admitting: Internal Medicine

## 2019-01-04 DIAGNOSIS — I10 Essential (primary) hypertension: Secondary | ICD-10-CM

## 2019-01-30 NOTE — Addendum Note (Signed)
Addended by: Bufford Spikes on: 01/30/2019 11:06 AM   Modules accepted: Orders

## 2019-03-27 ENCOUNTER — Ambulatory Visit (INDEPENDENT_AMBULATORY_CARE_PROVIDER_SITE_OTHER): Payer: Medicare Other | Admitting: Internal Medicine

## 2019-03-27 DIAGNOSIS — I10 Essential (primary) hypertension: Secondary | ICD-10-CM

## 2019-03-27 DIAGNOSIS — M109 Gout, unspecified: Secondary | ICD-10-CM | POA: Diagnosis not present

## 2019-03-27 MED ORDER — AMLODIPINE BESYLATE 10 MG PO TABS: 10.0000 mg | ORAL_TABLET | Freq: Every day | ORAL | 1 refills | Status: DC

## 2019-03-27 NOTE — Progress Notes (Signed)
   CC: HTN follow up  HPI:  Ms.Boneta Standre is a 79 y.o. female with PMHx as documented below, presented for HTN follow up. Please refer to problem based charting for further details and assessment and plan of current problem and chronic medical conditions.   Past Medical History:  Diagnosis Date  . Anxiety   . BPPV (benign paroxysmal positional vertigo)   . CAD (coronary artery disease)    4 stents placed prior to CABAG (1996 ? ) , s/p CABG 1996   . Diabetes mellitus   . Diabetes mellitus, type 2 (HCC) 05/09/2010  . Dizziness   . Hypertension   . Hypothyroid    Review of Systems:   Constitutional: Negative for chills and fever.  Respiratory: Negative for shortness of breath.   Cardiovascular: Negative for chest pain and leg swelling.  Gastrointestinal: Negative for abdominal pain, nausea and vomiting.  Neurological: Negative for dizziness and headaches.    Physical Exam:  Vitals:   03/27/19 1458 03/27/19 1527  BP: (!) 182/93 (!) 164/85  Pulse: 92 79  Temp: 98.2 F (36.8 C)   TempSrc: Oral   SpO2: 100%   Weight: 177 lb 6.4 oz (80.5 kg)   Height: 5\' 8"  (1.727 m)    Physical Exam  Constitutional: Well-developed and well-nourished. No acute distress.  HENT:  Head: Normocephalic and atraumatic.  Eyes: Conjunctivae are normal, EOM nl Cardiovascular: RRR, nl S1S2, no murmur, no LEE Respiratory: Effort normal and breath sounds normal. No respiratory distress. No wheezes.  GI: Soft. Bowel sounds are normal. No distension. There is no tenderness.  Neurological: Is alert and oriented x 3 Skin: Not diaphoretic. No erythema.  Psychiatric: Normal mood and affect. Behavior is normal. Judgment and thought content normal.   Assessment & Plan:   See Encounters Tab for problem based charting.   Patient discussed with Dr. 

## 2019-03-27 NOTE — Patient Instructions (Addendum)
It was our pleasure taking care of you in our clinic today.  Your blood pressure today improved but still elevated.  I increase the dosage of Amlodipine to 10 mg daily (instead of 5 mg daily). I sent the script to your pharmacy.   Please come back to clinic in 2 weeks for blood pressure check. Take your blood pressure at least twice a day in the morning and the evening. We talked about how high blood pressure put you at high risk of stroke, kidney disease and heart disease. Please take rest of your medications as before and contact us if you have any question or concern.  Please come back to clinic in 2 weeks or earlier if needed. As always, if having severe symptoms, please seek medical attention at emergency room.  Thank you Dr. Maryla Morrow

## 2019-03-28 NOTE — Assessment & Plan Note (Addendum)
Patient does not report any gout attack. Restarted Alluporinol in addition to Colchicine last visit given prior elevated uric acid. Will continue Allopurinol and colchicine for now and will check uric acid in 3 months. Will adjust meds if uric acid becomes therapeutic and if no more flair.

## 2019-03-28 NOTE — Assessment & Plan Note (Addendum)
Patient is here in clinic for follow up.  She missed her prior follow up appointments, but reports compliance with her medications. She mentions BP at home is ranges 135-160s/70-80. No headache, dizziness or chest pain.  Her BP today was 182/93 on arrival that improved to 160/85 on second check. She is asymptomatic. We discussed that her BP has improved but still very higher than the goal. We also discussed how uncontrolled HTN put her at high risk for stroke, kidney and heart disease even if she is asymptomatic with elevated blood pressure. She verbalizes understanding and agrees to follow up for a better control.  -Increase Amlodipine to 10 mg QD -Continue Losartan 100 mg QD -Avoiding HCTZ given Hx of gout -F/u in clinic in 2 weeks

## 2019-03-31 NOTE — Progress Notes (Signed)
Internal Medicine Clinic Attending  Case discussed with Dr. Masoudi at the time of the visit.  We reviewed the resident's history and exam and pertinent patient test results.  I agree with the assessment, diagnosis, and plan of care documented in the resident's note.  Arionne Iams, M.D., Ph.D.  

## 2019-04-10 ENCOUNTER — Other Ambulatory Visit: Payer: Self-pay | Admitting: Internal Medicine

## 2019-04-10 ENCOUNTER — Other Ambulatory Visit: Payer: Self-pay | Admitting: Student in an Organized Health Care Education/Training Program

## 2019-04-10 DIAGNOSIS — E039 Hypothyroidism, unspecified: Secondary | ICD-10-CM

## 2019-04-10 DIAGNOSIS — I10 Essential (primary) hypertension: Secondary | ICD-10-CM

## 2019-04-10 DIAGNOSIS — E785 Hyperlipidemia, unspecified: Secondary | ICD-10-CM

## 2019-04-14 ENCOUNTER — Other Ambulatory Visit: Payer: Self-pay | Admitting: *Deleted

## 2019-04-17 ENCOUNTER — Ambulatory Visit: Payer: Medicare Other | Attending: Internal Medicine

## 2019-04-17 DIAGNOSIS — Z23 Encounter for immunization: Secondary | ICD-10-CM

## 2019-04-17 NOTE — Progress Notes (Signed)
   Covid-19 Vaccination Clinic  Name:  Jill Golden    MRN: [id number] DOB: September 03, 1940  04/17/2019  [name] was observed post Covid-19 immunization for 15 minutes without incident. She was provided with Vaccine Information Sheet and instruction to access the V-Safe system.   [name] was instructed to call 911 with any severe reactions post vaccine: Marland Kitchen Difficulty breathing  . Swelling of face and throat  . A fast heartbeat  . A bad rash all over body  . Dizziness and weakness   Immunizations Administered    Name Date Dose VIS Date Route   Pfizer COVID-19 Vaccine 04/17/2019  4:03 PM 0.3 mL 12/20/2018 Intramuscular   Manufacturer: [employer], [employer]   Lot: [id number]   NDC: [address]-0

## 2019-04-24 ENCOUNTER — Ambulatory Visit (INDEPENDENT_AMBULATORY_CARE_PROVIDER_SITE_OTHER): Payer: Medicare Other | Admitting: Internal Medicine

## 2019-04-24 DIAGNOSIS — I10 Essential (primary) hypertension: Secondary | ICD-10-CM

## 2019-04-24 DIAGNOSIS — M109 Gout, unspecified: Secondary | ICD-10-CM

## 2019-04-24 DIAGNOSIS — E039 Hypothyroidism, unspecified: Secondary | ICD-10-CM

## 2019-04-24 DIAGNOSIS — N183 Chronic kidney disease, stage 3 unspecified: Secondary | ICD-10-CM | POA: Diagnosis not present

## 2019-04-24 NOTE — Patient Instructions (Addendum)
It was our pleasure taking care of you in our clinic today.   I discontinue Colchicine today. Continue great job of taking your blood pressure medications and check you blood pressure at home. Your blood pressure improved to 138/74 today.  Please take your medications as instructed and contact us if you have any question or concern.  Please come back to clinic in 6 months or earlier if needed. As always, if having severe symptoms, please seek medical attention at emergency room.  Thank you

## 2019-04-24 NOTE — Progress Notes (Signed)
   CC: HTN follow up  HPI:  Jill Golden is a 79 y.o. female with PMHx as documented below, presented for HTN follow up. Please refer to problem based charting for further details and assessment of plan of current problem and chronic medical conditions.   Past Medical History:  Diagnosis Date  . Anxiety   . BPPV (benign paroxysmal positional vertigo)   . CAD (coronary artery disease)    4 stents placed prior to CABAG (1996 ? ) , s/p CABG 1996   . Diabetes mellitus   . Diabetes mellitus, type 2 (HCC) 05/09/2010  . Dizziness   . Hypertension   . Hypothyroid    Review of Systems:   Constitutional: Negative for chills and fever.  Respiratory: Negative for shortness of breath.   Cardiovascular: Negative for chest pain and leg swelling.  Gastrointestinal: Negative for abdominal pain, nausea and vomiting.  Neurological: Negative for dizziness and headaches.   Physical Exam:  Vitals:   04/24/19 1355  BP: 138/74  Pulse: 81  Temp: 98.4 F (36.9 C)  TempSrc: Oral  SpO2: 99%  Weight: 175 lb 14.4 oz (79.8 kg)  Height: 5\' 8"  (1.727 m)    Constitutional: Well-developed and well-nourished. No acute distress.  Head: Normocephalic and atraumatic.  Eyes: Conjunctivae are normal, EOM nl Cardiovascular:  RRR, nl S1S2, no murmur,  no LEE Respiratory: Effort normal and breath sounds normal. No respiratory distress. No wheezes.  GI: Soft. Bowel sounds are normal. No distension. There is no tenderness.  Neurological: Is alert and oriented x 3  Skin: Not diaphoretic. No erythema.  Psychiatric:  Normal mood and affect. Behavior is normal. Judgment and thought content normal.   Assessment & Plan:   See Encounters Tab for problem based charting.    Patient discussed with Dr. 

## 2019-04-26 ENCOUNTER — Encounter: Payer: Self-pay | Admitting: Internal Medicine

## 2019-04-26 NOTE — Assessment & Plan Note (Signed)
Last TSH last checked 09/2017, was normal. -Continue Levothyroxin 50 mcg -Check TSH next visit (annualy)

## 2019-04-26 NOTE — Assessment & Plan Note (Addendum)
BP mediation changed to Amlodpine 10 (from 5) last visit. BP well improved.  BP today 138/74  -Continue Amlodipine 10 mg QD -Continue Losartan 100 mg QD -Avoiding HCTZ due to gout -Last Bmp 10/10/2018 with Nl Cr and improvemenet of GFR to 58 -BMP in 6 month

## 2019-04-26 NOTE — Assessment & Plan Note (Signed)
GFR 58 now. Improved.

## 2019-04-26 NOTE — Assessment & Plan Note (Signed)
Uric acid 5 months ago was 6.8 and we resumed Allopurinol and Colchicine Hx of 2 attacks last year. No further attacks or joint pain no tofus on exmam. No ankle or foot imaging in chart to check for erosion.  -DC colchicine -Continue allopurinol for ppx -gout diet

## 2019-04-28 NOTE — Progress Notes (Signed)
Internal Medicine Clinic Attending  Case discussed with Dr. Masoudi  at the time of the visit.  We reviewed the resident's history and exam and pertinent patient test results.  I agree with the assessment, diagnosis, and plan of care documented in the resident's note.  

## 2019-05-12 ENCOUNTER — Ambulatory Visit: Payer: Medicare Other | Attending: Internal Medicine

## 2019-05-12 DIAGNOSIS — Z23 Encounter for immunization: Secondary | ICD-10-CM

## 2019-05-12 NOTE — Progress Notes (Signed)
   Covid-19 Vaccination Clinic  Name:  Jill Golden    MRN: [id number] DOB: 01-03-1941  05/12/2019  Jill Golden was observed post Covid-19 immunization for 15 minutes without incident. She was provided with Vaccine Information Sheet and instruction to access the V-Safe system.   Jill Golden was instructed to call 911 with any severe reactions post vaccine: Marland Kitchen Difficulty breathing  . Swelling of face and throat  . A fast heartbeat  . A bad rash all over body  . Dizziness and weakness   Immunizations Administered    Name Date Dose VIS Date Route   Pfizer COVID-19 Vaccine 05/12/2019  4:07 PM 0.3 mL 03/05/2018 Intramuscular   Manufacturer: [employer], [employer]   Lot: [id number]   NDC: [address]-4

## 2019-06-26 ENCOUNTER — Other Ambulatory Visit: Payer: Self-pay

## 2019-06-26 ENCOUNTER — Ambulatory Visit (INDEPENDENT_AMBULATORY_CARE_PROVIDER_SITE_OTHER): Payer: Medicare Other | Admitting: Internal Medicine

## 2019-06-26 ENCOUNTER — Encounter: Payer: Medicare Other | Admitting: Internal Medicine

## 2019-06-26 ENCOUNTER — Encounter: Payer: Self-pay | Admitting: Internal Medicine

## 2019-06-26 VITALS — BP 133/68 | HR 80 | Temp 98.2°F | Ht 68.0 in | Wt 179.1 lb

## 2019-06-26 DIAGNOSIS — I10 Essential (primary) hypertension: Secondary | ICD-10-CM

## 2019-06-26 DIAGNOSIS — E039 Hypothyroidism, unspecified: Secondary | ICD-10-CM

## 2019-06-26 NOTE — Patient Instructions (Signed)
Thank you for allowing [place] to provide your care today. Today we discussed your thyroid disease, your blood pressure and your gout. No change in your medication  I have ordered thyroid blood work for you. I will call if any are abnormal.    Today we made no changes to your medications.    Please follow-up in clinic in 6-12 months or sooner as needed.    Should you have any questions or concerns please call the internal medicine clinic at [phone].  Thank you

## 2019-06-26 NOTE — Assessment & Plan Note (Signed)
Stable on current treatment. -Continue current regimen

## 2019-06-26 NOTE — Assessment & Plan Note (Signed)
Asymptomatic on Levothyroxin 50 mcg QD. Last TSH 09/2017 was normal.  -Checking TSH today

## 2019-06-26 NOTE — Progress Notes (Signed)
   CC: f/u of hypothyroidism  HPI:  [name] is a 79 y.o. female with PMHx as documented below, presented for f/u of hypothyroidism. Please refer to problem based charting for further details and assessment and plan of current problem and chronic medical conditions.  Past Medical History:  Diagnosis Date  . Anxiety   . BPPV (benign paroxysmal positional vertigo)   . CAD (coronary artery disease)    4 stents placed prior to CABAG (1996 ? ) , s/p CABG 1996   . Diabetes mellitus   . Diabetes mellitus, type 2 (HCC) 05/09/2010  . Dizziness   . Hypertension   . Hypothyroid    Review of Systems:   Constitutional: Negative for chills and fever.  Respiratory: Negative for shortness of breath.   Cardiovascular: Negative for chest pain and leg swelling.  Gastrointestinal: Negative for abdominal pain, nausea and vomiting.  Neurological: Negative for dizziness and headaches.  MSK: Negative joint pain, negative for joint swelling  Physical Exam:  Vitals:   06/26/19 1427  BP: 133/68  Pulse: 80  Temp: 98.2 F (36.8 C)  TempSrc: Oral  SpO2: 100%  Weight: 179 lb 1.6 oz (81.2 kg)  Height: 5\' 8"  (1.727 m)   Constitutional:Well-developed and well-nourished. No acute distress.  HENT:  Head: Normocephalic and atraumatic.  Eyes: Conjunctivae are normal, EOM nl Cardiovascular:  RRR, nl S1S2, no murmur,  no LEE Respiratory: Effort normal and breath sounds normal. No respiratory distress. No wheezes.  GI: Soft. Bowel sounds are normal. No distension. There is no tenderness.  Neurological: Is alert and oriented x 3  Skin: Not diaphoretic. No erythema.  Psychiatric:  Normal mood and affect. Behavior is normal. Judgment and thought content normal.   Assessment & Plan:   See Encounters Tab for problem based charting.  Patient discussed with Dr. 

## 2019-06-26 NOTE — Progress Notes (Deleted)
   CC: ***  HPI:  [name] is a 79 y.o. female with PMHx as documented below, presented with ***. Please refer to problem based charting for further details and assessment and plan of current problem and chronic medical conditions.  PMHx: HTN, CHF, hyperpara, hypothyroidism,CKD, CAD s/p CABG, Gout,  Meds: ASA, Allopurinol, amlodipine, synthroid, meclizine, pravastatin,  Past Medical History:  Diagnosis Date  . Anxiety   . BPPV (benign paroxysmal positional vertigo)   . CAD (coronary artery disease)    4 stents placed prior to CABAG (1996 ? ) , s/p CABG 1996   . Diabetes mellitus   . Diabetes mellitus, type 2 (HCC) 05/09/2010  . Dizziness   . Hypertension   . Hypothyroid    Review of Systems:  *** Constitutional: Negative for chills and fever.  Respiratory: Negative for shortness of breath.   Cardiovascular: Negative for chest pain and leg swelling.  Gastrointestinal: Negative for abdominal pain, nausea and vomiting.  Neurological: Negative for dizziness and headaches.   Physical Exam:  There were no vitals filed for this visit. *** Constitutional: ***Well-developed and well-nourished. No acute distress.  Head: Normocephalic and atraumatic.  Eyes: ***Conjunctivae are normal, EOM nl Cardiovascular: *** RRR, nl S1S2, ***no murmur, *** no LEE Respiratory: ***Effort normal and breath sounds normal. No respiratory distress. No wheezes.  GI: ***Soft. Bowel sounds are normal. No distension. There is no tenderness.  Neurological: Is alert and oriented x 3 *** Skin: Not diaphoretic. No erythema.  Psychiatric: *** Normal mood and affect. Behavior is normal. Judgment and thought content normal.    Assessment & Plan:  It was our pleasure taking care of you in our clinic today.  You were seen due to * I started*** for you. Please take your medications as instructed and contact us if you have any question or concern.  Please come back to clinic in **** or earlier if your  symptoms get worse or not improved. As always, if having severe symptoms, please seek medical attention at emergency room.  See Encounters Tab for problem based charting.  Patient {GC/GE:[id number]::"discussed with","seen with"} Dr. {NAMES:[id number]::"Butcher","Guilloud","Hoffman","Mullen","Narendra","Raines","Vincent"}

## 2019-06-27 LAB — TSH: TSH: 2.59 u[IU]/mL (ref 0.450–4.500)

## 2019-06-27 NOTE — Progress Notes (Signed)
Internal Medicine Clinic Attending  Case discussed with Dr. Masoudi  at the time of the visit.  We reviewed the resident's history and exam and pertinent patient test results.  I agree with the assessment, diagnosis, and plan of care documented in the resident's note.  

## 2019-07-13 ENCOUNTER — Other Ambulatory Visit: Payer: Self-pay | Admitting: Internal Medicine

## 2019-07-13 DIAGNOSIS — I10 Essential (primary) hypertension: Secondary | ICD-10-CM

## 2019-07-13 DIAGNOSIS — E785 Hyperlipidemia, unspecified: Secondary | ICD-10-CM

## 2019-07-13 DIAGNOSIS — E039 Hypothyroidism, unspecified: Secondary | ICD-10-CM

## 2019-07-18 ENCOUNTER — Other Ambulatory Visit: Payer: Self-pay

## 2019-07-18 DIAGNOSIS — I10 Essential (primary) hypertension: Secondary | ICD-10-CM

## 2019-07-18 MED ORDER — AMLODIPINE BESYLATE 10 MG PO TABS: 10.0000 mg | ORAL_TABLET | Freq: Every day | ORAL | 1 refills | Status: DC

## 2019-07-18 NOTE — Telephone Encounter (Signed)
Patient is requesting a 90 day supply. SChaplin, RN,BSN

## 2019-10-10 ENCOUNTER — Other Ambulatory Visit: Payer: Self-pay | Admitting: Internal Medicine

## 2019-10-10 DIAGNOSIS — E039 Hypothyroidism, unspecified: Secondary | ICD-10-CM

## 2019-10-10 DIAGNOSIS — I10 Essential (primary) hypertension: Secondary | ICD-10-CM

## 2019-10-10 DIAGNOSIS — E785 Hyperlipidemia, unspecified: Secondary | ICD-10-CM

## 2019-10-17 ENCOUNTER — Other Ambulatory Visit: Payer: Self-pay | Admitting: Internal Medicine

## 2019-10-17 DIAGNOSIS — I10 Essential (primary) hypertension: Secondary | ICD-10-CM

## 2019-11-04 ENCOUNTER — Ambulatory Visit: Payer: Medicare Other

## 2019-12-13 ENCOUNTER — Ambulatory Visit: Payer: Medicare Other | Attending: Internal Medicine

## 2019-12-13 DIAGNOSIS — Z23 Encounter for immunization: Secondary | ICD-10-CM

## 2019-12-13 NOTE — Progress Notes (Signed)
   Covid-19 Vaccination Clinic  Name:  Jill Golden    MRN: [id number] DOB: 20-Sep-1940  12/13/2019  [name] was observed post Covid-19 immunization for 15 minutes without incident. She was provided with Vaccine Information Sheet and instruction to access the V-Safe system.   [name] was instructed to call 911 with any severe reactions post vaccine: Marland Kitchen Difficulty breathing  . Swelling of face and throat  . A fast heartbeat  . A bad rash all over body  . Dizziness and weakness   Immunizations Administered    Name Date Dose VIS Date Route   Pfizer COVID-19 Vaccine 12/13/2019 10:30 AM 0.3 mL 10/29/2019 Intramuscular   Manufacturer: [employer], [employer]   Lot: [id number]   NDC: [address]-2

## 2020-01-16 ENCOUNTER — Other Ambulatory Visit: Payer: Self-pay | Admitting: Internal Medicine

## 2020-01-16 DIAGNOSIS — E785 Hyperlipidemia, unspecified: Secondary | ICD-10-CM

## 2020-01-16 DIAGNOSIS — E039 Hypothyroidism, unspecified: Secondary | ICD-10-CM

## 2020-01-16 NOTE — Telephone Encounter (Signed)
Refills sent. Please make appt with Dr. Maryla Morrow next month. Thanks!

## 2020-02-12 ENCOUNTER — Telehealth: Payer: Self-pay

## 2020-02-12 NOTE — Telephone Encounter (Signed)
Error

## 2020-02-25 ENCOUNTER — Encounter: Payer: Medicare Other | Admitting: Internal Medicine

## 2020-03-06 ENCOUNTER — Other Ambulatory Visit: Payer: Self-pay | Admitting: Internal Medicine

## 2020-03-06 DIAGNOSIS — I10 Essential (primary) hypertension: Secondary | ICD-10-CM

## 2020-03-08 NOTE — Telephone Encounter (Signed)
Front office - can you schedule pt an appt? Last 2 appts were canceled.Thanks

## 2020-03-09 NOTE — Telephone Encounter (Signed)
Spoke with the pt and she has sch an appt for 03/16/2020 with Dr. Claudette Laws @ 3:15 pm.  Pt aware of her missed appt due to taking care of a family member and plans to keep this appt.

## 2020-03-16 ENCOUNTER — Encounter: Payer: Self-pay | Admitting: Student

## 2020-03-16 ENCOUNTER — Ambulatory Visit (INDEPENDENT_AMBULATORY_CARE_PROVIDER_SITE_OTHER): Payer: Medicare Other | Admitting: Student

## 2020-03-16 VITALS — BP 125/65 | HR 72 | Wt 177.6 lb

## 2020-03-16 DIAGNOSIS — M109 Gout, unspecified: Secondary | ICD-10-CM | POA: Diagnosis not present

## 2020-03-16 DIAGNOSIS — I10 Essential (primary) hypertension: Secondary | ICD-10-CM

## 2020-03-16 DIAGNOSIS — E039 Hypothyroidism, unspecified: Secondary | ICD-10-CM

## 2020-03-16 NOTE — Patient Instructions (Addendum)
Jill Golden,   Thank you for your visit to the [facility] Medicine [facility] today. It was such a pleasure meeting you. Today we discussed the following:  1) Hypertension - Continue taking your amlodipine and losartan - I ordered a basic metabolic panel blood test to make sure your kidney function and electrolytes are good. I will call you with the result.  2) Hypothyroidism - Continue your levothyroxine 50 mcg daily  3) Gout - Please let us know if you develop symptoms concerning for a gout flare   We would like to see you back in 6 months or sooner as needed. Please bring all of your medications with you.   If you have any questions or concerns, please call our clinic at [phone] between 9am-5pm. Outside of these hours, call [phone] and ask for the internal medicine resident on call. If you feel you are having a medical emergency please call 911.

## 2020-03-16 NOTE — Progress Notes (Signed)
   CC: follow-up of HTN, hypothyroidism, gout  HPI:  [name] is a 80 y.o. woman with history as below who presents to clinic for follow-up of her chronic medical conditions. Her last clinic visit was on 06/26/19 with Dr. Maryla Morrow.   To see the details of this patient's management of their acute and chronic problems, please refer to the Assessment & Plan under the Encounters tab.    Past Medical History:  Diagnosis Date  . Anxiety   . BPPV (benign paroxysmal positional vertigo)   . CAD (coronary artery disease)    4 stents placed prior to CABAG (1996 ? ) , s/p CABG 1996   . Diabetes mellitus   . Diabetes mellitus, type 2 (HCC) 05/09/2010  . Dizziness   . Hypertension   . Hypothyroid    Review of Systems:    Review of Systems  All other systems reviewed and are negative.   Physical Exam:  Vitals:   03/16/20 1458 03/16/20 1603  BP: 135/74 125/65  Pulse: 84 72  SpO2: 99%   Weight: 177 lb 9.6 oz (80.6 kg)    Constitutional: very pleasant and well-appearing woman sitting in chair, in no acute distress, appears younger than stated age HENT: normocephalic atraumatic, mucous membranes moist Eyes: conjunctiva non-erythematous Neck: supple Cardiovascular: regular rate and rhythm, no m/r/g Pulmonary/Chest: normal work of breathing on room air, lungs clear to auscultation bilaterally Abdominal: soft, non-tender, non-distended MSK: normal bulk and tone Neurological: alert & oriented x 3, normal gait Skin: warm and dry Psych: normal mood and affect    Assessment & Plan:   See Encounters Tab for problem based charting.  Patient discussed with Dr. Mayford Knife

## 2020-03-16 NOTE — Assessment & Plan Note (Signed)
Patient reports she has not had a gout attack in over a year since stopping HCTZ. Reports she has not been taking her allopurinol 100 mg daily for the last 2-3 months. No joint pain on exam.  Last uric acid 6.8 in 11/2018.   Assessment/Plan: Had a discussion of the risks and benefits of taking or not taking daily allopurinol for gout prophylaxis. Patient states she would like to hold off on checking her uric acid or taking allopurinol at this time. Discussed returning to clinic and addressing her gout needs should she have an attack. - Patient has prescription for allopurinol 100 mg daily but is not taking at this time - Gout diet

## 2020-03-16 NOTE — Assessment & Plan Note (Addendum)
BP 135/74 today, 125/65 on repeat. Patient reports adherence to amlodipine 10 mg daily and losartan 100 mg daily. States she monitors her BP with a cuff daily and BP averages in the 130s/70s. Denies headache, blurry vision, dizziness.   Last BMP from 10/10/18 with K 3.4, creatinine 0.93.  Assessment/Plan: Initial blood pressure not at goal of <130/80, however repeat at goal. Had risk/benefit discussion with patient of being more aggressive with her BP control, and she states she is happy with where her BP is currently and would prefer to continue her current regimen. 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 Called to discuss result with patient. Because we don't have interval sCr measurements since the BMP last year, we cannot be sure whether this is an acute change or represents progression of CKD. Will recheck when the patient follows up in 6 months.  rechecking in 6 months and determining how to proceed then.

## 2020-03-16 NOTE — Assessment & Plan Note (Signed)
Patient reports adherence to levothyroxine 50 mcg daily. Denies current or previous symptoms such as dry skin, cold intolerance, hair changes, weight gain.  Last TSH in 06/2019 was normal at 2.590.  A/P: Stable. No need to repeat TSH at this time. - continue levothyroxine 50 mcg daily

## 2020-03-17 LAB — BMP8+ANION GAP
Anion Gap: 17 mmol/L (ref 10.0–18.0)
BUN/Creatinine Ratio: 7 — ABNORMAL LOW (ref 12–28)
BUN: 9 mg/dL (ref 8–27)
CO2: 21 mmol/L (ref 20–29)
Calcium: 10 mg/dL (ref 8.7–10.3)
Chloride: 104 mmol/L (ref 96–106)
Creatinine, Ser: 1.29 mg/dL — ABNORMAL HIGH (ref 0.57–1.00)
Glucose: 98 mg/dL (ref 65–99)
Potassium: 4.3 mmol/L (ref 3.5–5.2)
Sodium: 142 mmol/L (ref 134–144)
eGFR: 41 mL/min/{1.73_m2} — ABNORMAL LOW (ref >59)

## 2020-03-23 NOTE — Progress Notes (Signed)
Internal Medicine Clinic Attending  Case discussed with Dr. Claudette Laws  At the time of the visit.  We reviewed the resident's history and exam and pertinent patient test results.  I agree with the assessment, diagnosis, and plan of care documented in the resident's note. Patient may no longer need the allopurinol at all now that her thiazide was discontinued; very reasonable to remain off at this time.

## 2020-03-31 ENCOUNTER — Other Ambulatory Visit: Payer: Self-pay | Admitting: Internal Medicine

## 2020-03-31 DIAGNOSIS — I10 Essential (primary) hypertension: Secondary | ICD-10-CM

## 2020-03-31 DIAGNOSIS — E785 Hyperlipidemia, unspecified: Secondary | ICD-10-CM

## 2020-03-31 DIAGNOSIS — E039 Hypothyroidism, unspecified: Secondary | ICD-10-CM

## 2020-04-11 IMAGING — CT CT HEAD W/O CM
3 series · 16 of 47 positions shown, 19 images · non-contrast
Comparison: 05/26/2014

CLINICAL DATA: Headache, blurred vision, hypertension.

EXAM:
CT HEAD WITHOUT CONTRAST
TECHNIQUE: Contiguous axial images were obtained from the base of the skull
through the vertex without intravenous contrast.

[Series 2: head wo · axial · 0.47mm/px · z∈[-123,+2]mm · 10 of 31 slices shown, 13 images]
[im 3/31  brain]
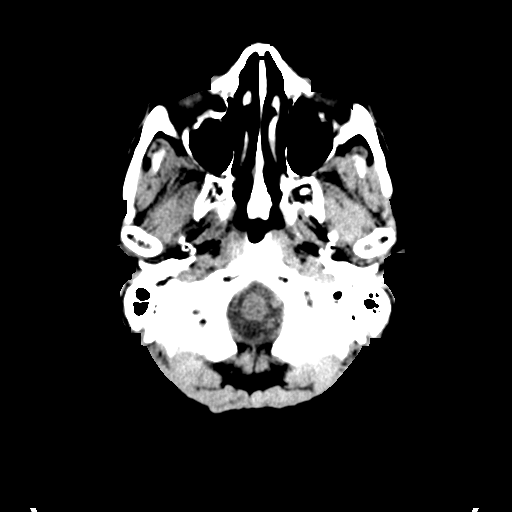
[im 3/31  bone]
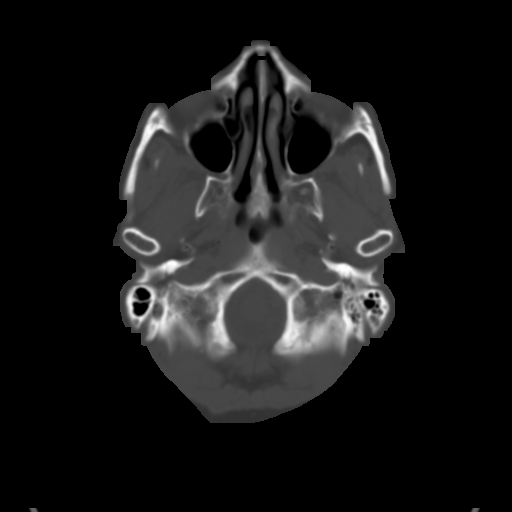
[im 6/31  brain]
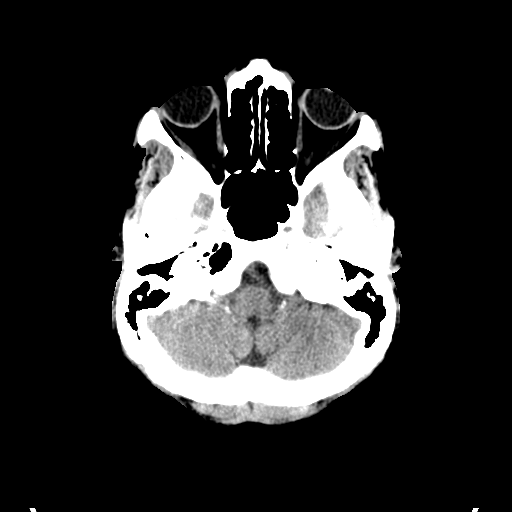
[im 9/31  brain]
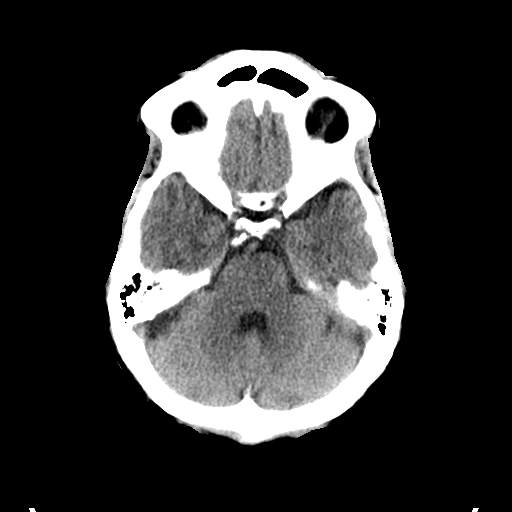
[im 11/31  brain]
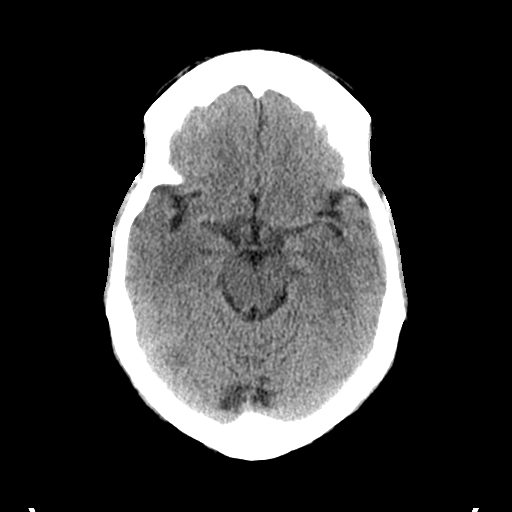
[im 14/31  brain]
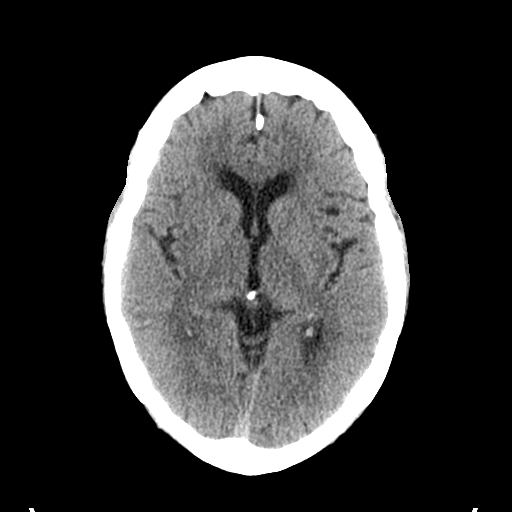
[im 14/31  bone]
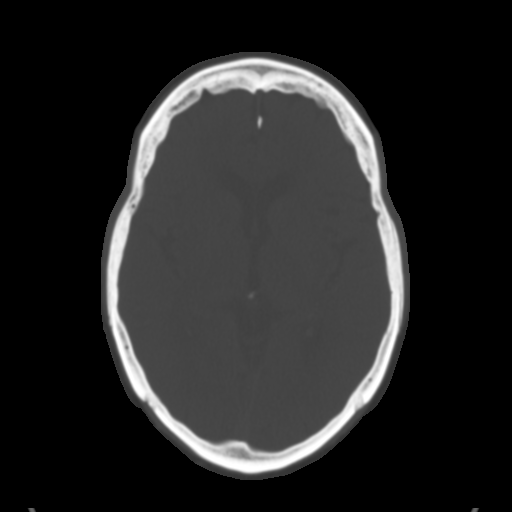
[im 17/31  brain]
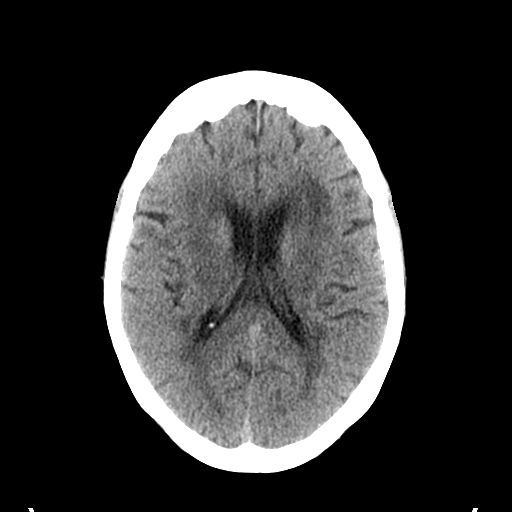
[im 20/31  brain]
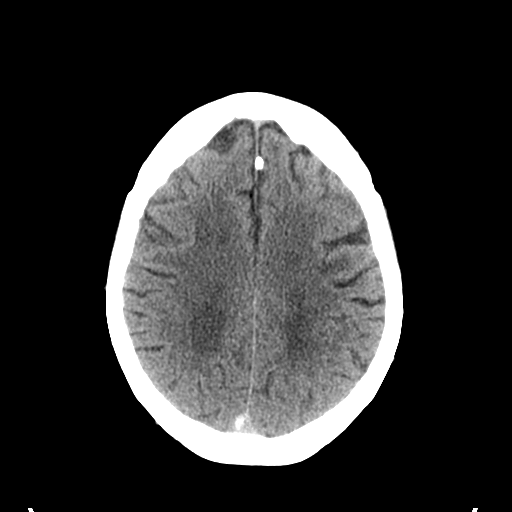
[im 23/31  brain]
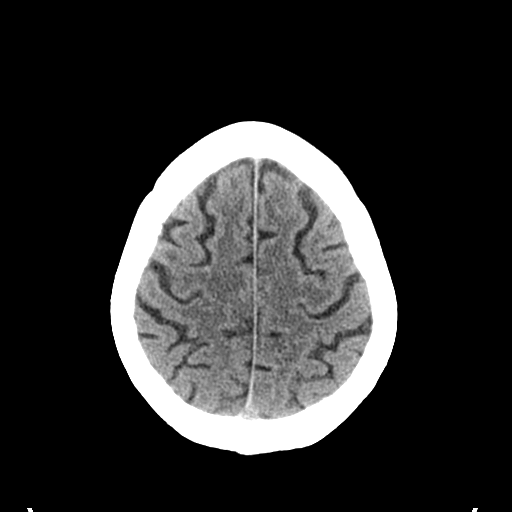
[im 25/31  brain]
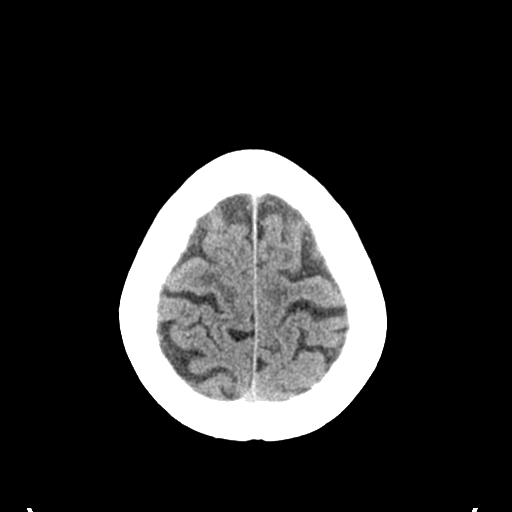
[im 25/31  bone]
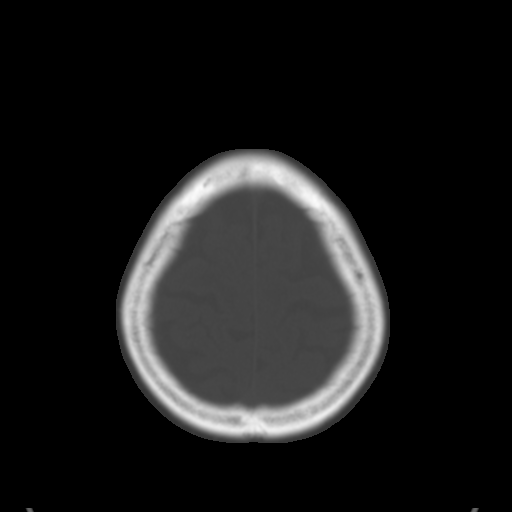
[im 28/31  brain]
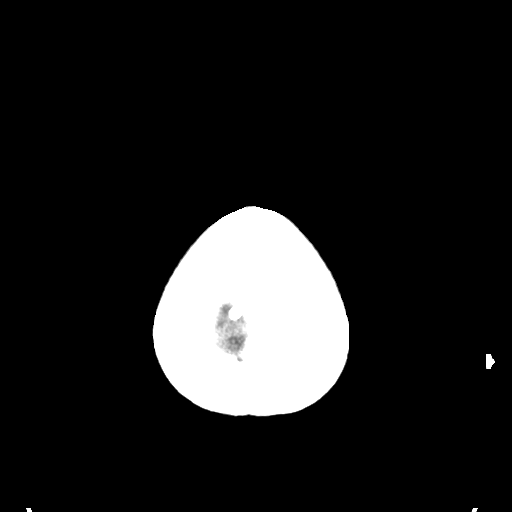

[Series 5: coronal soft tissue · coronal · 0.30mm/px · 3 of 65 slices shown]
[im 22/65  brain]
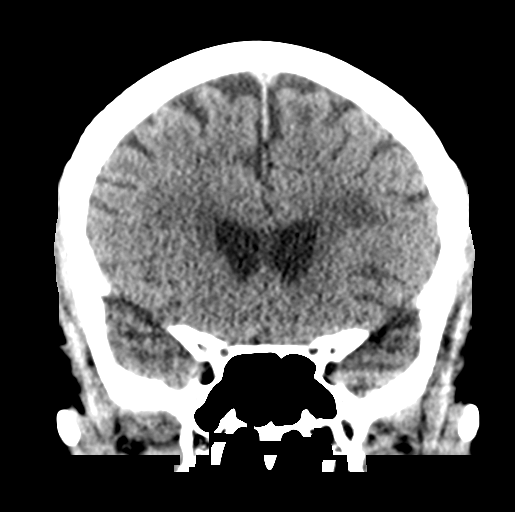
[im 29/65  brain]
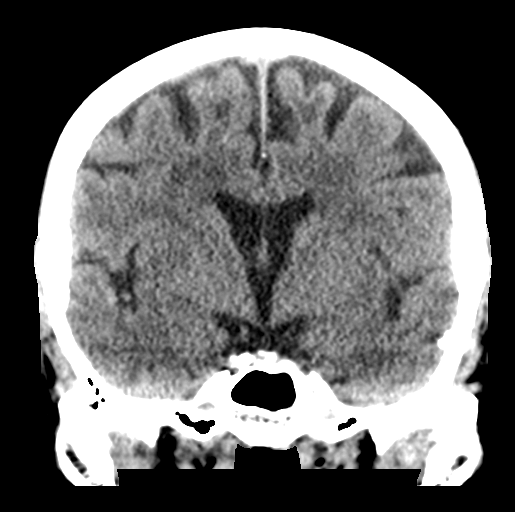
[im 36/65  brain]
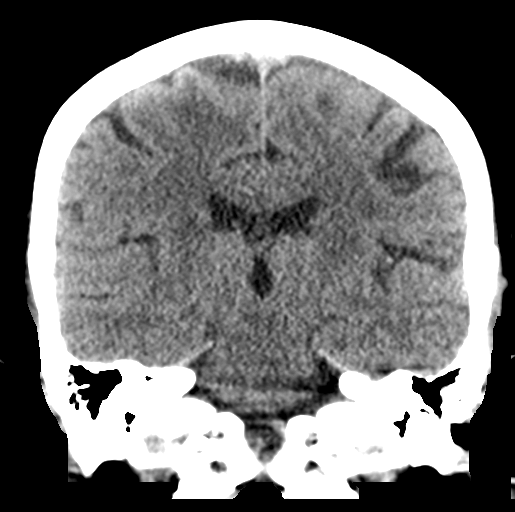

[Series 6: sagittal soft tissue · sagittal · 0.30mm/px · 3 of 53 slices shown]
[im 18/53  brain]
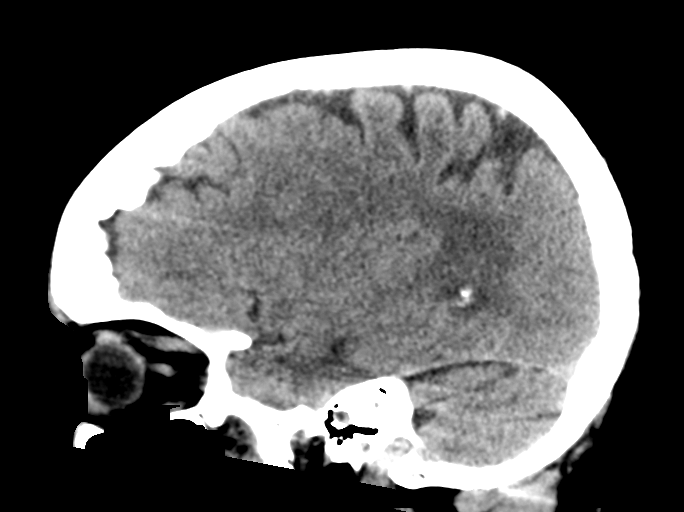
[im 27/53  brain]
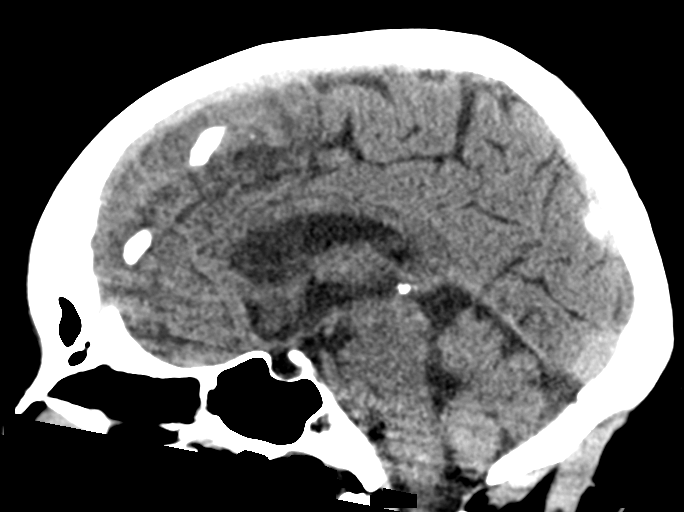
[im 35/53  brain]
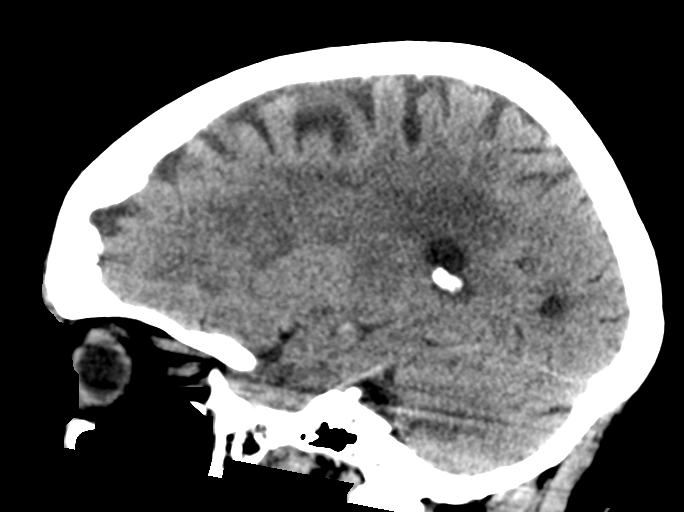

[16 of 47 positions shown; findings below may reference images not displayed]

FINDINGS: Brain: There is atrophy and chronic small vessel disease changes. No
acute intracranial abnormality. Specifically, no hemorrhage,
hydrocephalus, mass lesion, acute infarction, or significant
intracranial injury.

Vascular: No hyperdense vessel or unexpected calcification.

Skull: No acute calvarial abnormality.

Sinuses/Orbits: Visualized paranasal sinuses and mastoids clear.
Orbital soft tissues unremarkable.

Other: None
IMPRESSION: Atrophy, chronic microvascular disease.

No acute intracranial abnormality.

## 2020-05-22 ENCOUNTER — Encounter: Payer: Self-pay | Admitting: *Deleted

## 2020-05-22 NOTE — Progress Notes (Unsigned)

## 2020-05-24 ENCOUNTER — Encounter: Payer: Self-pay | Admitting: Internal Medicine

## 2020-05-24 NOTE — Progress Notes (Signed)
Things That May Be Affecting Your Health:  Alcohol  Hearing loss  Pain    Depression  Home Safety  Sexual Health   Diabetes x Lack of physical activity  Stress  x Difficulty with daily activities  Loneliness  Tiredness   Drug use  Medicines  Tobacco use   Falls  Motor Vehicle Safety x Weight   Food choices  Oral Health  Other    YOUR PERSONALIZED HEALTH PLAN : 1. Schedule your next subsequent Medicare Wellness visit in one year 2. Attend all of your regular appointments to address your medical issues 3. Complete the preventative screenings and services   Annual Wellness Visit   Medicare Covered Preventative Screenings and Services  Services & Screenings Men and Women Who How Often Need? Date of Last Service Action  Abdominal Aortic Aneurysm Adults with AAA risk factors Once x     Alcohol Misuse and Counseling All Adults Screening once a year if no alcohol misuse. Counseling up to 4 face to face sessions.     Bone Density Measurement  Adults at risk for osteoporosis Once every 2 yrs x     Lipid Panel Z13.6 All adults without CV disease Once every 5 yrs x      Colorectal Cancer   Stool sample or  Colonoscopy All adults 50 and older   Once every year  Every 10 years        Depression All Adults Once a year  Today   Diabetes Screening Blood glucose, post glucose load, or GTT Z13.1  All adults at risk  Pre-diabetics  Once per year  Twice per year      Diabetes  Self-Management Training All adults Diabetics 10 hrs first year; 2 hours subsequent years. Requires Copay     Glaucoma  Diabetics  Family history of glaucoma  African Americans 50 yrs +  Hispanic Americans 65 yrs + Annually - requires coppay      Hepatitis C Z72.89 or F19.20  High Risk for HCV  Born between 1945 and 1965  Annually  Once      HIV Z11.4 All adults based on risk  Annually btw ages 16 & 10 regardless of risk  Annually > 65 yrs if at increased risk      Lung Cancer Screening  Asymptomatic adults aged 39-77 with 30 pack yr history and current smoker OR quit within the last 15 yrs Annually Must have counseling and shared decision making documentation before first screen      Medical Nutrition Therapy Adults with   Diabetes  Renal disease  Kidney transplant within past 3 yrs 3 hours first year; 2 hours subsequent years     Obesity and Counseling All adults Screening once a year Counseling if BMI 30 or higher  Today   Tobacco Use Counseling Adults who use tobacco  Up to 8 visits in one year     Vaccines Z23  Hepatitis B  Influenza   Pneumonia  Adults   Once  Once every flu season  Two different vaccines separated by one year     Next Annual Wellness Visit People with Medicare Every year  Today     Services & Screenings Women Who How Often Need  Date of Last Service Action  Mammogram  Z12.31 Women over 40 One baseline ages 54-39. Annually ager 40 yrs+      Pap tests All women Annually if high risk. Every 2 yrs for normal risk women  Screening for cervical cancer with   Pap (Z01.419 nl or Z01.411abnl) &  HPV Z11.51 Women aged 31 to 36 Once every 5 yrs     Screening pelvic and breast exams All women Annually if high risk. Every 2 yrs for normal risk women     Sexually Transmitted Diseases  Chlamydia  Gonorrhea  Syphilis All at risk adults Annually for non pregnant females at increased risk         Services & Screenings Men Who How Ofter Need  Date of Last Service Action  Prostate Cancer - DRE & PSA Men over 50 Annually.  DRE might require a copay.        Sexually Transmitted Diseases  Syphilis All at risk adults Annually for men at increased risk      Health Maintenance List Health Maintenance  Topic Date Due  . DEXA SCAN  Never done  . TETANUS/TDAP  07/31/2020  . INFLUENZA VACCINE  08/09/2020  . COVID-19 Vaccine  Completed  . PNA vac Low Risk Adult  Completed  . HPV VACCINES  Aged Out

## 2020-06-07 ENCOUNTER — Encounter: Payer: Self-pay | Admitting: *Deleted

## 2020-06-20 ENCOUNTER — Other Ambulatory Visit: Payer: Self-pay | Admitting: Internal Medicine

## 2020-06-20 ENCOUNTER — Other Ambulatory Visit: Payer: Self-pay | Admitting: Student

## 2020-06-20 DIAGNOSIS — E039 Hypothyroidism, unspecified: Secondary | ICD-10-CM

## 2020-06-20 DIAGNOSIS — E785 Hyperlipidemia, unspecified: Secondary | ICD-10-CM

## 2020-06-20 DIAGNOSIS — I10 Essential (primary) hypertension: Secondary | ICD-10-CM

## 2020-07-03 ENCOUNTER — Other Ambulatory Visit: Payer: Self-pay

## 2020-07-03 ENCOUNTER — Inpatient Hospital Stay (HOSPITAL_COMMUNITY)
Admission: EM | Admit: 2020-07-03 | Discharge: 2020-07-05 | DRG: 641 | Disposition: A | Discharge status: Home / Self Care | Payer: Medicare Other | Attending: Student in an Organized Health Care Education/Training Program | Admitting: Student in an Organized Health Care Education/Training Program

## 2020-07-03 DIAGNOSIS — F419 Anxiety disorder, unspecified: Secondary | ICD-10-CM | POA: Diagnosis present

## 2020-07-03 DIAGNOSIS — I951 Orthostatic hypotension: Secondary | ICD-10-CM | POA: Diagnosis present

## 2020-07-03 DIAGNOSIS — Z79899 Other long term (current) drug therapy: Secondary | ICD-10-CM

## 2020-07-03 DIAGNOSIS — Z7982 Long term (current) use of aspirin: Secondary | ICD-10-CM

## 2020-07-03 DIAGNOSIS — I5032 Chronic diastolic (congestive) heart failure: Secondary | ICD-10-CM | POA: Diagnosis not present

## 2020-07-03 DIAGNOSIS — G4700 Insomnia, unspecified: Secondary | ICD-10-CM | POA: Diagnosis present

## 2020-07-03 DIAGNOSIS — Z20822 Contact with and (suspected) exposure to covid-19: Secondary | ICD-10-CM | POA: Diagnosis not present

## 2020-07-03 DIAGNOSIS — N182 Chronic kidney disease, stage 2 (mild): Secondary | ICD-10-CM | POA: Diagnosis present

## 2020-07-03 DIAGNOSIS — I130 Hypertensive heart and chronic kidney disease with heart failure and stage 1 through stage 4 chronic kidney disease, or unspecified chronic kidney disease: Secondary | ICD-10-CM | POA: Diagnosis not present

## 2020-07-03 DIAGNOSIS — R42 Dizziness and giddiness: Secondary | ICD-10-CM | POA: Diagnosis not present

## 2020-07-03 DIAGNOSIS — R55 Syncope and collapse: Secondary | ICD-10-CM | POA: Diagnosis present

## 2020-07-03 DIAGNOSIS — D509 Iron deficiency anemia, unspecified: Secondary | ICD-10-CM | POA: Diagnosis present

## 2020-07-03 DIAGNOSIS — E860 Dehydration: Principal | ICD-10-CM | POA: Diagnosis present

## 2020-07-03 DIAGNOSIS — N179 Acute kidney failure, unspecified: Secondary | ICD-10-CM | POA: Diagnosis present

## 2020-07-03 DIAGNOSIS — E1122 Type 2 diabetes mellitus with diabetic chronic kidney disease: Secondary | ICD-10-CM | POA: Diagnosis not present

## 2020-07-03 DIAGNOSIS — Z888 Allergy status to other drugs, medicaments and biological substances status: Secondary | ICD-10-CM

## 2020-07-03 DIAGNOSIS — E039 Hypothyroidism, unspecified: Secondary | ICD-10-CM | POA: Diagnosis present

## 2020-07-03 DIAGNOSIS — Z87891 Personal history of nicotine dependence: Secondary | ICD-10-CM

## 2020-07-03 DIAGNOSIS — Z880 Allergy status to penicillin: Secondary | ICD-10-CM

## 2020-07-03 DIAGNOSIS — Z951 Presence of aortocoronary bypass graft: Secondary | ICD-10-CM

## 2020-07-03 DIAGNOSIS — I2510 Atherosclerotic heart disease of native coronary artery without angina pectoris: Secondary | ICD-10-CM | POA: Diagnosis present

## 2020-07-03 DIAGNOSIS — E785 Hyperlipidemia, unspecified: Secondary | ICD-10-CM | POA: Diagnosis present

## 2020-07-03 HISTORY — DX: Syncope and collapse: R55

## 2020-07-03 LAB — URINALYSIS, ROUTINE W REFLEX MICROSCOPIC
Bilirubin Urine: NEGATIVE
Glucose, UA: NEGATIVE mg/dL
Hgb urine dipstick: NEGATIVE
Ketones, ur: NEGATIVE mg/dL
Leukocytes,Ua: NEGATIVE
Nitrite: NEGATIVE
Protein, ur: NEGATIVE mg/dL
Specific Gravity, Urine: 1.006 (ref 1.005–1.030)
pH: 7 (ref 5.0–8.0)

## 2020-07-03 LAB — CBC WITH DIFFERENTIAL/PLATELET
Abs Immature Granulocytes: 0.06 10*3/uL (ref 0.00–0.07)
Basophils Absolute: 0 10*3/uL (ref 0.0–0.1)
Basophils Relative: 0 %
Eosinophils Absolute: 0.1 10*3/uL (ref 0.0–0.5)
Eosinophils Relative: 1 %
HCT: 33.2 % — ABNORMAL LOW (ref 36.0–46.0)
Hemoglobin: 10.3 g/dL — ABNORMAL LOW (ref 12.0–15.0)
Immature Granulocytes: 1 %
Lymphocytes Relative: 18 %
Lymphs Abs: 1.8 10*3/uL (ref 0.7–4.0)
MCH: 23.7 pg — ABNORMAL LOW (ref 26.0–34.0)
MCHC: 31 g/dL (ref 30.0–36.0)
MCV: 76.5 fL — ABNORMAL LOW (ref 80.0–100.0)
Monocytes Absolute: 0.7 10*3/uL (ref 0.1–1.0)
Monocytes Relative: 6 %
Neutro Abs: 7.8 10*3/uL — ABNORMAL HIGH (ref 1.7–7.7)
Neutrophils Relative %: 74 %
Platelets: 321 10*3/uL (ref 150–400)
RBC: 4.34 MIL/uL (ref 3.87–5.11)
RDW: 17.5 % — ABNORMAL HIGH (ref 11.5–15.5)
WBC: 10.5 10*3/uL (ref 4.0–10.5)
nRBC: 0 % (ref 0.0–0.2)

## 2020-07-03 LAB — TSH: TSH: 2.215 u[IU]/mL (ref 0.350–4.500)

## 2020-07-03 LAB — BASIC METABOLIC PANEL
Anion gap: 7 (ref 5–15)
BUN: 14 mg/dL (ref 8–23)
CO2: 23 mmol/L (ref 22–32)
Calcium: 9 mg/dL (ref 8.9–10.3)
Chloride: 109 mmol/L (ref 98–111)
Creatinine, Ser: 1.51 mg/dL — ABNORMAL HIGH (ref 0.44–1.00)
GFR, Estimated: 34 mL/min — ABNORMAL LOW (ref >60)
Glucose, Bld: 118 mg/dL — ABNORMAL HIGH (ref 70–99)
Potassium: 3.9 mmol/L (ref 3.5–5.1)
Sodium: 139 mmol/L (ref 135–145)

## 2020-07-03 LAB — RESP PANEL BY RT-PCR (FLU A&B, COVID) ARPGX2
Influenza A by PCR: NEGATIVE
Influenza B by PCR: NEGATIVE
SARS Coronavirus 2 by RT PCR: NEGATIVE

## 2020-07-03 LAB — CBG MONITORING, ED: Glucose-Capillary: 118 mg/dL — ABNORMAL HIGH (ref 70–99)

## 2020-07-03 LAB — MAGNESIUM: Magnesium: 2.2 mg/dL (ref 1.7–2.4)

## 2020-07-03 MED ORDER — ACETAMINOPHEN 325 MG PO TABS
650.0000 mg | ORAL_TABLET | Freq: Four times a day (QID) | ORAL | Status: DC | PRN
  Administered 2020-07-04: 650 mg via ORAL
  Filled 2020-07-03: qty 2

## 2020-07-03 MED ORDER — ACETAMINOPHEN 650 MG RE SUPP: 650.0000 mg | Freq: Four times a day (QID) | RECTAL | Status: DC | PRN

## 2020-07-03 MED ORDER — AMLODIPINE BESYLATE 10 MG PO TABS
10.0000 mg | ORAL_TABLET | Freq: Every day | ORAL | Status: DC
  Administered 2020-07-04 – 2020-07-05 (×2): 10 mg via ORAL
  Filled 2020-07-03 (×2): qty 1

## 2020-07-03 MED ORDER — SODIUM CHLORIDE 0.9 % IV BOLUS
500.0000 mL | Freq: Once | INTRAVENOUS | Status: AC
  Administered 2020-07-03: 500 mL via INTRAVENOUS

## 2020-07-03 MED ORDER — PRAVASTATIN SODIUM 40 MG PO TABS
40.0000 mg | ORAL_TABLET | Freq: Every day | ORAL | Status: DC
  Administered 2020-07-04 – 2020-07-05 (×2): 40 mg via ORAL
  Filled 2020-07-03 (×2): qty 1

## 2020-07-03 MED ORDER — LEVOTHYROXINE SODIUM 50 MCG PO TABS
50.0000 ug | ORAL_TABLET | Freq: Every day | ORAL | Status: DC
  Administered 2020-07-04 – 2020-07-05 (×2): 50 ug via ORAL
  Filled 2020-07-03 (×2): qty 1

## 2020-07-03 MED ORDER — LACTATED RINGERS IV SOLN: INTRAVENOUS | Status: AC

## 2020-07-03 MED ORDER — ASPIRIN EC 81 MG PO TBEC
81.0000 mg | DELAYED_RELEASE_TABLET | Freq: Every morning | ORAL | Status: DC
  Administered 2020-07-04 – 2020-07-05 (×2): 81 mg via ORAL
  Filled 2020-07-03 (×2): qty 1

## 2020-07-03 MED ORDER — SENNOSIDES-DOCUSATE SODIUM 8.6-50 MG PO TABS: 1.0000 | ORAL_TABLET | Freq: Every evening | ORAL | Status: DC | PRN

## 2020-07-03 MED ORDER — SODIUM CHLORIDE 0.9% FLUSH
3.0000 mL | Freq: Two times a day (BID) | INTRAVENOUS | Status: DC
  Administered 2020-07-04 – 2020-07-05 (×2): 3 mL via INTRAVENOUS

## 2020-07-03 NOTE — ED Triage Notes (Signed)
Pt from home BIB EMS for syncopal episodes x2. Pt was in the checkout line at [facility] and started to feel hot/flushed and lightheaded. Pt had a syncopal episode and was lowered to the ground. No fall or injury. Pt had another episode with EMS. BP 100/60 with EMS. Pt denies CP/SOB/NVD. Pt reports feeling dizzy and eyes "fuzzy" on arrival to ED. No recent changes in BP meds.

## 2020-07-03 NOTE — H&P (Signed)
Date: 07/03/2020               [name] MRN: [id number]  DOB: Aug 15, 1940 Age / Sex: 80 y.o., female   PCP: Miguel Aschoff, MD         Medical Service: Internal Medicine Teaching Service         Attending Physician: Dr. Gust Rung, DO    First Contact: Dr. Thalia Bloodgood Pager: [phone]  Second Contact: Dr. Verdene Lennert Pager: [phone]       After Hours (After 5p/  First Contact Pager: [phone]  weekends / holidays): Second Contact Pager: [phone]   Chief Complaint: syncope  History of Present Illness: Jill Golden is a 80 year old female with PMHx of CAD s/p CABG in 1996, diabetes mellitus, hypertension, hyperlipidemia, BPPV, HFpEF, and hypothyroidism presenting after a syncopal episode. [place] when she suddenly felt very hot and faint. Following this, she had a syncopal episode at which time she was lowered to the ground. She did not fall or hit her head. She had a brief loss of consciousness. She reports feeling "fuzzy-headed and hot" prior to the episode; however, denies any headache, dizziness/lightheadedness, focal weakness, nausea/vomiting, abdominal pain, chest pain or palpitations, or decreased appetite. She reports staying hydrated on most days; however, reports only drinking one bottle of water today.   She reports she had a similar episode two weeks prior when she was at a graduation at the [facility]. She was walking outside after the graduation when she felt very hot and fainted. She was not evaluated at this time.   ED Course: Patient presented after a syncopal event with prodromal symptoms. On EMS arrival, patient appeared pale but alert and oriented. Vital signs obtained by EMS wnl; however, when she tried to stand up, she immediately lost consciousness for a few seconds and shortly regained consciousness when she was assisted back into the chair at which time her radial pulse was noted to be thready but regular.  In the ED, she received some IV fluids. No arrhythmias noted on EKG. Mild increase in creatinine on BMP. CBC with microcytic anemia, new compared to prior labs. Patient admitted for cardiac monitoring and to set up with event monitor.    Meds:  Current Meds  Medication Sig   amLODipine (NORVASC) 10 MG tablet Take 1 tablet by mouth once daily (Patient taking differently: Take 10 mg by mouth in the morning.)   aspirin EC 81 MG tablet Take 81 mg by mouth in the morning.   EUTHYROX 50 MCG tablet TAKE 1 TABLET BY MOUTH ONCE DAILY BEFORE BREAKFAST (Patient taking differently: Take 50 mcg by mouth daily before breakfast.)   losartan (COZAAR) 100 MG tablet Take 1 tablet by mouth once daily (Patient taking differently: Take 100 mg by mouth in the morning.)   pravastatin (PRAVACHOL) 40 MG tablet Take 1 tablet by mouth once daily (Patient taking differently: Take 40 mg by mouth daily.)    Allergies: Allergies as of 07/03/2020 - Review Complete 07/03/2020  Allergen Reaction Noted   Atenolol Other (See Comments) 08/13/2015   Penicillins Shortness Of Breath 05/09/2010   Kiwi extract Other (See Comments) 07/03/2020   Lisinopril Cough 02/08/2011   Past Medical History:  Diagnosis Date   Anxiety    BPPV (benign paroxysmal positional vertigo)    CAD (coronary artery disease)    4 stents placed prior to CABAG (1996 ? ) , s/p  CABG 1996    Diabetes mellitus    Diabetes mellitus, type 2 (HCC) 05/09/2010   Dizziness    Hypertension    Hypothyroid     Family History:  No family history on file.  Social History:  Patient is independent in ADL's. She has a history of tobacco use; however, has not had any tobacco products since 1996. Denies current alcohol use or illicit drug use.   Review of Systems: A complete ROS was negative except as per HPI.   Physical Exam: Blood pressure (!) 148/76, pulse (!) 106, temperature 98.4 F (36.9 C), resp. rate 18, last menstrual period 05/09/1982, SpO2 98  %. Physical Exam  Constitutional: Appears well-developed and well-nourished. No distress.  HENT: Normocephalic and atraumatic, EOMI, conjunctiva normal, moist mucous membranes Cardiovascular: Normal rate, regular rhythm, S1 and S2 present, no murmurs, rubs, gallops.  Distal pulses intact Respiratory: No respiratory distress, no accessory muscle use.  Effort is normal on room air.  Lungs are clear to auscultation bilaterally. GI: Nondistended, soft, nontender to palpation, normal active bowel sounds Musculoskeletal: Normal bulk and tone.  No peripheral edema noted. Neurological: Is alert and oriented x4, no apparent focal deficits noted. Skin: Warm and dry.  No rash, erythema, lesions noted. Psychiatric: Normal mood and affect. Behavior is normal. Judgment and thought content normal.   EKG: personally reviewed my interpretation is NSR, incomplete RBBB, LVH, no significant ST segment changes, HR 60, Qtc 440  Assessment & Plan by Problem: Active Problems:   Syncope Jill Golden is an 80 year old female with PMHx of hypertension, hyperlipidemia, CAD s/p CABG in 1996, HFpEF, and hypothyroidism presented for evaluation of a syncopal episode.   Syncope Patient presents after syncopal episode with prodromal symptoms that she describes as feeling "fuzzy headed and hot". She had brief loss of consciousness on standing with EMS. Similar episode approximately two weeks prior when she was outside at a graduation ceremony. She was not evaluated at that time. Denies any head trauma at either episode. She does report drinking 8-10 bottles of water daily; however, notes that she had not been drinking much today because she was out shopping. Suspect may have component of dehydration contributing to her syncope.  - LR 100 cc/hr  - Orthostatic vitals - Cardiac monitoring - Echo - Thyroid studies   AKI on CKD II  Patient has hx of CKDII with baseline 1.29. She had slight increase in serum creatinine to 1.51,  likely in setting of decreased oral intake as noted above. UA neg for acute infection. She has received 500cc NS in the ED.  - LR 100cc/hr x10hr as above - Trend renal function - Avoid nephrotoxic agents as able   Microcytic anemia  Patient with Hb 10.3, MCV 76 (baseline 12-13). She denies any signs of bleeding. She reports daily bowel movements without melena or hematochezia. No recent colonoscopy.  - Trend CBC - Iron studies   Hypertension Resume home amlodipine. Will hold losartan in setting of AKI as above.   Hyperlipidemia Resume pravastatin 40mg  daily  Hypothyroidism Resume Synthroid daily  Check TSH as above   CAD s/p CABG HFpEF Resume aspirin 81mg  daily Echo as above   Diet: HH Fluids: NS 75 cc/hr DVT Prophylaxis: SCDs Code status: FULL Code   Dispo: Admit patient to Observation with expected length of stay less than 2 midnights.  Signed: , MD IMTS PGY-2 07/03/2020, 10:25 PM  Pager: [phone] After 5pm on weekdays and 1pm on weekends: On Call  pager: [phone]

## 2020-07-03 NOTE — ED Provider Notes (Signed)
[facility] EMERGENCY DEPARTMENT Provider Note   CSN: [id number] Arrival date & time: 07/03/20  1459     History Chief Complaint  Patient presents with   Loss of Consciousness    Jill Golden is a 80 y.o. female.  Patient is a 80 year old female with a history of diabetes, hypertension, hypothyroidism and coronary artery disease as well as CHF who presents with a syncopal episode.  She states that she was walking outside at a shopping mall and as she was checking out in one of the stores she became very hot and felt faint.  She then had a syncopal episode.  She was lowered to the ground.  She did not fall and hit the ground.  She had a brief loss of consciousness.  She currently says she feels still a little fuzzy but denies any other symptoms.  She did not ever have any chest pain or shortness of breath.  No palpitations.  She had a similar episode 2 weeks ago when she was at a graduation at the [facility].  She did not eat breakfast and did not take her morning medicines.  When she walked out to the car she had a brief loss of consciousness but was not evaluated at that time.  She otherwise says she has been feeling well.  No recent illnesses.  No vomiting or diarrhea.      Past Medical History:  Diagnosis Date   Anxiety    BPPV (benign paroxysmal positional vertigo)    CAD (coronary artery disease)    4 stents placed prior to CABAG (1996 ? 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   Counseling regarding advanced directives and  goals of care 05/22/2014   Glaucoma 05/19/2013   CKD (chronic kidney disease) stage 3, GFR 30-59 ml/min (HCC) 12/02/2012   Insomnia 08/13/2012   Gout 04/25/2012   Hypothyroidism 04/26/2011   Hyperlipidemia 01/24/2011   Estrogen deficiency 01/24/2011   Hypertension 05/09/2010   S/P CABG (coronary artery bypass graft) 05/09/2010   BPPV (benign paroxysmal positional vertigo) 05/09/2010   Counseling on health promotion and disease prevention 05/09/2010    Past Surgical History:  Procedure Laterality Date   ABDOMINAL HYSTERECTOMY     APPENDECTOMY     CHOLECYSTECTOMY     CORONARY ARTERY BYPASS GRAFT     TUBAL LIGATION       OB History   No obstetric history on file.     No family history on file.  Social History   Tobacco Use   Smoking status: Former    Pack years: 0.00    Types: Cigarettes    Quit date: 05/09/1994    Years since quitting: 26.1   Smokeless tobacco: Never  Substance Use Topics   Alcohol use: No    Alcohol/week: 0.0 standard drinks   Drug use: No    Home Medications Prior to Admission medications   Medication Sig Start Date End Date Taking? Authorizing Provider  amLODipine (NORVASC) 10 MG tablet Take 1 tablet by mouth  once daily Patient taking differently: Take 10 mg by mouth in the morning. 06/21/20  Yes Miguel Aschoff, MD  aspirin EC 81 MG tablet Take 81 mg by mouth in the morning.   Yes [provider]  EUTHYROX 50 MCG tablet TAKE 1 TABLET BY MOUTH ONCE DAILY BEFORE BREAKFAST Patient taking differently: Take 50 mcg by mouth daily before breakfast. 06/21/20  Yes Miguel Aschoff, MD  losartan (COZAAR) 100 MG tablet Take 1 tablet by mouth once daily Patient taking differently: Take 100 mg by mouth in the morning. 06/21/20  Yes Miguel Aschoff, MD  pravastatin (PRAVACHOL) 40 MG tablet Take 1 tablet by mouth once daily Patient taking differently: Take 40 mg by mouth daily. 06/21/20  Yes Miguel Aschoff, MD    Allergies     Atenolol, Penicillins, Kiwi extract, and Lisinopril  Review of Systems   Review of Systems  Constitutional:  Positive for fatigue. Negative for chills, diaphoresis and fever.  HENT:  Negative for congestion, rhinorrhea and sneezing.   Eyes: Negative.   Respiratory:  Negative for cough, chest tightness and shortness of breath.   Cardiovascular:  Negative for chest pain and leg swelling.  Gastrointestinal:  Negative for abdominal pain, blood in stool, diarrhea, nausea and vomiting.  Genitourinary:  Negative for difficulty urinating, flank pain, frequency and hematuria.  Musculoskeletal:  Negative for arthralgias and back pain.  Skin:  Negative for rash.  Neurological:  Positive for syncope and light-headedness. Negative for dizziness, speech difficulty, weakness, numbness and headaches.   Physical Exam Updated Vital Signs BP 140/77 (BP Location: Right Arm)   Pulse 78   Temp 97.6 F (36.4 C) (Oral)   Resp 16   LMP 05/09/1982   SpO2 100%   Physical Exam Constitutional:      Appearance: She is well-developed.  HENT:     Head: Normocephalic and atraumatic.  Eyes:     Pupils: Pupils are equal, round, and reactive to light.  Cardiovascular:     Rate and Rhythm: Normal rate and regular rhythm.     Heart sounds: Normal heart sounds.  Pulmonary:     Effort: Pulmonary effort is normal. No respiratory distress.     Breath sounds: Normal breath sounds. No wheezing or rales.  Chest:     Chest wall: No tenderness.  Abdominal:     General: Bowel sounds are normal.     Palpations: Abdomen is soft.     Tenderness: There is no abdominal tenderness. There is no guarding or rebound.  Musculoskeletal:        General: Normal range of motion.     Cervical back: Normal range of motion and neck supple.  Lymphadenopathy:     Cervical: No cervical adenopathy.  Skin:    General: Skin is warm and dry.     Findings: No rash.  Neurological:     General: No focal deficit present.     Mental  Status: She is alert and oriented to person, place, and time.     Comments: Motor 5/5 all extremities Sensation grossly intact to LT all extremities Finger to Nose intact, no pronator drift CN II-XII grossly intact      ED Results / Procedures / Treatments   Labs (all labs ordered are listed, but only abnormal results are displayed) Labs Reviewed  BASIC METABOLIC PANEL - Abnormal; Notable for the following components:      Result Value   Glucose, Bld 118 (*)    Creatinine, Ser 1.51 (*)  GFR, Estimated 34 (*)    All other components within normal limits  CBC WITH DIFFERENTIAL/PLATELET - Abnormal; Notable for the following components:   Hemoglobin 10.3 (*)    HCT 33.2 (*)    MCV 76.5 (*)    MCH 23.7 (*)    RDW 17.5 (*)    Neutro Abs 7.8 (*)    All other components within normal limits  URINALYSIS, ROUTINE W REFLEX MICROSCOPIC - Abnormal; Notable for the following components:   Color, Urine STRAW (*)    All other components within normal limits  CBG MONITORING, ED - Abnormal; Notable for the following components:   Glucose-Capillary 118 (*)    All other components within normal limits  RESP PANEL BY RT-PCR (FLU A&B, COVID) ARPGX2  MAGNESIUM  TSH  BASIC METABOLIC PANEL  CBC    EKG EKG Interpretation  Date/Time:  Saturday July 03 2020 15:20:46 EDT Ventricular Rate:  60 PR Interval:  186 QRS Duration: 111 QT Interval:  440 QTC Calculation: 440 R Axis:   10 Text Interpretation: Sinus rhythm RSR' in V1 or V2, right VCD or RVH Probable left ventricular hypertrophy since last tracing no significant change Confirmed by Rolan Bucco [phone]) on 07/03/2020 3:36:55 PM  Radiology No results found.  Procedures Procedures   Medications Ordered in ED Medications  sodium chloride flush (NS) 0.9 % injection 3 mL (has no administration in time range)  acetaminophen (TYLENOL) tablet 650 mg (has no administration in time range)    Or  acetaminophen (TYLENOL) suppository 650  mg (has no administration in time range)  senna-docusate (Senokot-S) tablet 1 tablet (has no administration in time range)  sodium chloride 0.9 % bolus 500 mL (0 mLs Intravenous Stopped 07/03/20 1719)    ED Course  I have reviewed the triage vital signs and the nursing notes.  Pertinent labs & imaging results that were available during my care of the patient were reviewed by me and considered in my medical decision making (see chart for details).    MDM Rules/Calculators/A&P                          Patient is a 80 year old female who presents with a syncopal event.  She had some preceding episodes of feeling hot and lightheaded.  She feels better now.  She was given IV fluids.  Her labs are nonconcerning.  She does not have any noted arrhythmias on her EKG.  A little bit concerning is that she had a similar episode 2 weeks ago.  I spoke with the internal medicine service to admit the patient for monitoring and possibly set her up for an event monitor. Final Clinical Impression(s) / ED Diagnoses Final diagnoses:  Syncope and collapse    Rx / DC Orders ED Discharge Orders     None        Rolan Bucco, MD 07/03/20 2124

## 2020-07-04 ENCOUNTER — Other Ambulatory Visit: Payer: Self-pay

## 2020-07-04 ENCOUNTER — Encounter (HOSPITAL_COMMUNITY): Payer: Self-pay | Admitting: Internal Medicine

## 2020-07-04 ENCOUNTER — Observation Stay (HOSPITAL_COMMUNITY): Payer: Medicare Other

## 2020-07-04 ENCOUNTER — Other Ambulatory Visit (HOSPITAL_COMMUNITY): Payer: Medicare Other

## 2020-07-04 DIAGNOSIS — D649 Anemia, unspecified: Secondary | ICD-10-CM | POA: Diagnosis not present

## 2020-07-04 DIAGNOSIS — I361 Nonrheumatic tricuspid (valve) insufficiency: Secondary | ICD-10-CM | POA: Diagnosis not present

## 2020-07-04 DIAGNOSIS — Z951 Presence of aortocoronary bypass graft: Secondary | ICD-10-CM | POA: Diagnosis not present

## 2020-07-04 DIAGNOSIS — Z888 Allergy status to other drugs, medicaments and biological substances status: Secondary | ICD-10-CM | POA: Diagnosis not present

## 2020-07-04 DIAGNOSIS — I2510 Atherosclerotic heart disease of native coronary artery without angina pectoris: Secondary | ICD-10-CM | POA: Diagnosis present

## 2020-07-04 DIAGNOSIS — E785 Hyperlipidemia, unspecified: Secondary | ICD-10-CM

## 2020-07-04 DIAGNOSIS — I10 Essential (primary) hypertension: Secondary | ICD-10-CM

## 2020-07-04 DIAGNOSIS — I5032 Chronic diastolic (congestive) heart failure: Secondary | ICD-10-CM | POA: Diagnosis present

## 2020-07-04 DIAGNOSIS — I951 Orthostatic hypotension: Secondary | ICD-10-CM | POA: Diagnosis present

## 2020-07-04 DIAGNOSIS — E039 Hypothyroidism, unspecified: Secondary | ICD-10-CM | POA: Diagnosis present

## 2020-07-04 DIAGNOSIS — E860 Dehydration: Secondary | ICD-10-CM | POA: Diagnosis present

## 2020-07-04 DIAGNOSIS — Z20822 Contact with and (suspected) exposure to covid-19: Secondary | ICD-10-CM | POA: Diagnosis present

## 2020-07-04 DIAGNOSIS — R55 Syncope and collapse: Secondary | ICD-10-CM

## 2020-07-04 DIAGNOSIS — Z880 Allergy status to penicillin: Secondary | ICD-10-CM | POA: Diagnosis not present

## 2020-07-04 DIAGNOSIS — Z87891 Personal history of nicotine dependence: Secondary | ICD-10-CM | POA: Diagnosis not present

## 2020-07-04 DIAGNOSIS — Z79899 Other long term (current) drug therapy: Secondary | ICD-10-CM | POA: Diagnosis not present

## 2020-07-04 DIAGNOSIS — D509 Iron deficiency anemia, unspecified: Secondary | ICD-10-CM

## 2020-07-04 DIAGNOSIS — N179 Acute kidney failure, unspecified: Secondary | ICD-10-CM | POA: Diagnosis present

## 2020-07-04 DIAGNOSIS — G4700 Insomnia, unspecified: Secondary | ICD-10-CM | POA: Diagnosis present

## 2020-07-04 DIAGNOSIS — F419 Anxiety disorder, unspecified: Secondary | ICD-10-CM | POA: Diagnosis present

## 2020-07-04 DIAGNOSIS — Z7982 Long term (current) use of aspirin: Secondary | ICD-10-CM | POA: Diagnosis not present

## 2020-07-04 DIAGNOSIS — E1122 Type 2 diabetes mellitus with diabetic chronic kidney disease: Secondary | ICD-10-CM | POA: Diagnosis present

## 2020-07-04 DIAGNOSIS — I130 Hypertensive heart and chronic kidney disease with heart failure and stage 1 through stage 4 chronic kidney disease, or unspecified chronic kidney disease: Secondary | ICD-10-CM | POA: Diagnosis present

## 2020-07-04 DIAGNOSIS — N182 Chronic kidney disease, stage 2 (mild): Secondary | ICD-10-CM | POA: Diagnosis present

## 2020-07-04 LAB — ECHOCARDIOGRAM COMPLETE
AR max vel: 2.77 cm2
AV Area VTI: 2.82 cm2
AV Area mean vel: 2.63 cm2
AV Mean grad: 5 mmHg
AV Peak grad: 8.4 mmHg
Ao pk vel: 1.45 m/s
Area-P 1/2: 2.28 cm2
Height: 68 in
MV VTI: 2.47 cm2
S' Lateral: 2.5 cm
Weight: 2857.16 oz

## 2020-07-04 LAB — BASIC METABOLIC PANEL
Anion gap: 5 (ref 5–15)
BUN: 12 mg/dL (ref 8–23)
CO2: 24 mmol/L (ref 22–32)
Calcium: 9 mg/dL (ref 8.9–10.3)
Chloride: 111 mmol/L (ref 98–111)
Creatinine, Ser: 1.04 mg/dL — ABNORMAL HIGH (ref 0.44–1.00)
GFR, Estimated: 54 mL/min — ABNORMAL LOW (ref >60)
Glucose, Bld: 114 mg/dL — ABNORMAL HIGH (ref 70–99)
Potassium: 3.4 mmol/L — ABNORMAL LOW (ref 3.5–5.1)
Sodium: 140 mmol/L (ref 135–145)

## 2020-07-04 LAB — CBC
HCT: 30.5 % — ABNORMAL LOW (ref 36.0–46.0)
Hemoglobin: 9.8 g/dL — ABNORMAL LOW (ref 12.0–15.0)
MCH: 24 pg — ABNORMAL LOW (ref 26.0–34.0)
MCHC: 32.1 g/dL (ref 30.0–36.0)
MCV: 74.8 fL — ABNORMAL LOW (ref 80.0–100.0)
Platelets: 305 10*3/uL (ref 150–400)
RBC: 4.08 MIL/uL (ref 3.87–5.11)
RDW: 17.3 % — ABNORMAL HIGH (ref 11.5–15.5)
WBC: 11.7 10*3/uL — ABNORMAL HIGH (ref 4.0–10.5)
nRBC: 0 % (ref 0.0–0.2)

## 2020-07-04 LAB — T4, FREE: Free T4: 0.81 ng/dL (ref 0.61–1.12)

## 2020-07-04 LAB — IRON AND TIBC
Iron: 32 ug/dL (ref 28–170)
Saturation Ratios: 9 % — ABNORMAL LOW (ref 10.4–31.8)
TIBC: 357 ug/dL (ref 250–450)
UIBC: 325 ug/dL

## 2020-07-04 LAB — FERRITIN: Ferritin: 7 ng/mL — ABNORMAL LOW (ref 11–307)

## 2020-07-04 MED ORDER — FERROUS SULFATE 325 (65 FE) MG PO TABS
325.0000 mg | ORAL_TABLET | Freq: Every day | ORAL | Status: DC
  Administered 2020-07-04: 325 mg via ORAL
  Filled 2020-07-04: qty 1

## 2020-07-04 MED ORDER — LACTATED RINGERS IV BOLUS
500.0000 mL | Freq: Once | INTRAVENOUS | Status: AC
  Administered 2020-07-04: 500 mL via INTRAVENOUS

## 2020-07-04 MED ORDER — LACTATED RINGERS IV SOLN: INTRAVENOUS | Status: AC

## 2020-07-04 NOTE — Evaluation (Signed)
Physical Therapy Evaluation Patient Details Name: Jill Golden MRN: [id number] DOB: 08-Jul-1940 Today's Date: 07/04/2020   History of Present Illness  Jill Golden is a 80 year old female presenting after a syncopal episode. She [place] the shopping [place] when she suddenly felt very hot and faint. Following this, she had a syncopal episode at which time she was lowered to the ground. She did not fall or hit her head. She had a brief loss of consciousness. She reports feeling "fuzzy-headed and hot" prior to the episode;  with PMHx of CAD s/p CABG in 1996, diabetes mellitus, hypertension, hyperlipidemia, BPPV, HFpEF, and hypothyroidism  Clinical Impression   Pt admitted with above diagnosis. Comes from home where she lives alone; very suportive son and dtr-in-law local; Independent at baseline; hasn't driven since episode of vertigo (a few years ago); Presents to PT with decr activity tolerance; specifically, decr tolerance of prolonged upright activity;   Pt currently with functional limitations due to the deficits listed below (see PT Problem List). Pt will benefit from skilled PT to increase their independence and safety with mobility to allow discharge to the venue listed below.    Please see other PT note posted at 3:08pm for serial BPs and discussion.     Follow Up Recommendations Outpatient PT;Other (comment) (for continued monitoring of activity tolerance and faciliatation fo independence with safe walking and exercise pgm)    Equipment Recommendations  None recommended by PT (May consider Rollator RW, so taht pt can have a safe option to sit whenever she needs to)    Recommendations for Other Services OT consult (as ordered)     Precautions / Restrictions Precautions Precautions: Fall Precaution Comments: Noted BP drifts down with incr time in standing; reports feeling "fuzzy" and hot when close to fainting      Mobility  Bed Mobility Overal bed mobility: Independent                   Transfers Overall transfer level: Modified independent Equipment used: None             General transfer comment: No overt difficulty  Ambulation/Gait Ambulation/Gait assistance: Supervision;Min assist Gait Distance (Feet): 110 Feet Assistive device: None;1 person hand held assist Gait Pattern/deviations: Step-through pattern     General Gait Details: initially, walking with not difficulty, gait pattern grossly WNL; Cues to self-monitor for activity tolerance, and reported incr "fuzzy"-lightheaded feeling after approx 50 ft; able to walk back to room with her arm around this PT's shoulder, and close monitor  Stairs            Wheelchair Mobility    Modified Rankin (Stroke Patients Only)       Balance     Sitting balance-Leahy Scale: Good       Standing balance-Leahy Scale: Fair                               Pertinent Vitals/Pain Pain Assessment: No/denies pain Pain Score: 0-No pain Pain Intervention(s): Monitored during session    Home Living Family/patient expects to be discharged to:: Private residence Living Arrangements: Children Available Help at Discharge: Available 24 hours/day Type of Home: Apartment Home Access: Level entry     Home Layout: One level Home Equipment: Grab bars - toilet;Grab bars - tub/shower;Tub bench Additional Comments: Pt's son and daughter in law are very supportive    Prior Function Level of Independence: Independent  Comments: 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     Hand Dominance   Dominant Hand: Right    Extremity/Trunk Assessment   Upper Extremity Assessment Upper Extremity Assessment: Defer to OT evaluation    Lower Extremity Assessment Lower Extremity Assessment: Generalized weakness (Initially with strength and power for smooth sit to stand and walking; noting significant fatigue with prolonged standing/walking/upright activity)    Cervical / Trunk  Assessment Cervical / Trunk Assessment: Normal  Communication   Communication: No difficulties  Cognition Arousal/Alertness: Awake/alert Behavior During Therapy: WFL for tasks assessed/performed Overall Cognitive Status: Within Functional Limits for tasks assessed                                        General Comments General comments (skin integrity, edema, etc.): Took extra time to further explore limits of pt's activity tolerance, and answer pt and son's questions    Exercises     Assessment/Plan    PT Assessment Patient needs continued PT services  PT Problem List Decreased activity tolerance;Decreased knowledge of precautions;Cardiopulmonary status limiting activity       PT Treatment Interventions DME instruction;Gait training;Stair training;Functional mobility training;Therapeutic activities;Therapeutic exercise;Balance training;Patient/family education    PT Goals (Current goals can be found in the Care Plan section)  Acute Rehab PT Goals Patient Stated Goal: to figure out why she is blacking out PT Goal Formulation: With patient Time For Goal Achievement: 07/18/20 Potential to Achieve Goals: Good    Frequency Min 3X/week   Barriers to discharge        Co-evaluation               AM-PAC PT "6 Clicks" Mobility  Outcome Measure Help needed turning from your back to your side while in a flat bed without using bedrails?: None Help needed moving from lying on your back to sitting on the side of a flat bed without using bedrails?: None Help needed moving to and from a bed to a chair (including a wheelchair)?: None Help needed standing up from a chair using your arms (e.g., wheelchair or bedside chair)?: None Help needed to walk in hospital room?: None Help needed climbing 3-5 steps with a railing? : None 6 Click Score: 24    End of Session   Activity Tolerance: Other (comment) (fatige with hallway amb after 15-20 minutes of static  standing) Patient left: in bed;with call bell/phone within reach;with family/visitor present Nurse Communication: Mobility status;Other (comment) (results of serial BPs) PT Visit Diagnosis: Other abnormalities of gait and mobility (R26.89)    Time: 6754-4920 PT Time Calculation (min) (ACUTE ONLY): 29 min   Charges:   PT Evaluation $PT Eval Moderate Complexity: 1 Mod PT Treatments $Gait Training: 8-22 mins        Van Clines, PT  Acute Rehabilitation Services Pager [phone] Office [phone]   Levi Aland 07/04/2020, 3:30 PM

## 2020-07-04 NOTE — Progress Notes (Signed)
Occupational Therapy Evaluation Patient Details Name: Jill Golden MRN: [id number] DOB: [birth year] Today's Date: 07/04/2020    History of Present Illness Jill Golden is a 80 year old female presenting after a syncopal episode. [place] when she suddenly felt very hot and faint. Following this, she had a syncopal episode at which time she was lowered to the ground. She did not fall or hit her head. She had a brief loss of consciousness. She reports feeling "fuzzy-headed and hot" prior to the episode;  with PMHx of CAD s/p CABG in 1996, diabetes mellitus, hypertension, hyperlipidemia, BPPV, HFpEF, and hypothyroidism   Clinical Impression   Jill Golden was evaluated s/p the above impairments. 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 Upon evaluation pt is supervision for all mobility and ADLs for safety. Pt became orthostatic, without symptoms, after sit<>stand transfers, details below. Pt does not have acute OT needs at this time, OT will sign off. Recommend d/c home with supervision 24/7 initially for all ADLs and mobility. Pt would benefit from 3 in 1 for night time use to increase safety in the home.                                               BP                  SpO2            Pulse  Supine                               135/71             98                 80 Sitting EOB                        141/76             99                 82 Standing                            115/83            99                 102 Standing after 2 minutes    146/82             98                 90 Supine                                147/72             98                 81     Follow Up Recommendations  No OT follow up;Supervision/Assistance - 24 hour    Equipment Recommendations  3 in 1  bedside commode       Precautions / Restrictions  Precautions Precautions: Fall Precaution Comments: orthostatic BP Restrictions Weight Bearing Restrictions: No      Mobility Bed Mobility Overal bed mobility: Independent       Transfers Overall transfer level: Modified independent Equipment used: None             General transfer comment: +use of bed rails    Balance Overall balance assessment: Needs assistance Sitting-balance support: No upper extremity supported;Feet supported Sitting balance-Leahy Scale: Good     Standing balance support: No upper extremity supported;During functional activity Standing balance-Leahy Scale: Fair            ADL either performed or assessed with clinical judgement   ADL Overall ADL's : Needs assistance/impaired Eating/Feeding: Independent;Sitting   Grooming: Wash/dry hands;Wash/dry face;Oral care;Applying deodorant;Brushing hair;Supervision/safety;Standing   Upper Body Bathing: Supervision/ safety;Sitting;Standing   Lower Body Bathing: Supervison/ safety;Sit to/from stand   Upper Body Dressing : Supervision/safety;Sitting   Lower Body Dressing: Supervision/safety;Sit to/from stand   Toilet Transfer: Supervision/safety;Ambulation;Grab bars;Regular Social worker and Hygiene: Supervision/safety;Sit to/from stand       Functional mobility during ADLs: Supervision/safety General ADL Comments: supervision for all ADLs, no apparent LOB     Vision Baseline Vision/History: Glaucoma Patient Visual Report: No change from baseline              Pertinent Vitals/Pain Pain Assessment: Faces Pain Score: 0-No pain Faces Pain Scale: No hurt Pain Intervention(s): Monitored during session     Hand Dominance Right   Extremity/Trunk Assessment Upper Extremity Assessment Upper Extremity Assessment: Overall WFL for tasks assessed   Lower Extremity Assessment Lower Extremity Assessment: Defer to PT evaluation   Cervical / Trunk  Assessment Cervical / Trunk Assessment: Normal   Communication Communication Communication: No difficulties   Cognition Arousal/Alertness: Awake/alert Behavior During Therapy: WFL for tasks assessed/performed Overall Cognitive Status: Within Functional Limits for tasks assessed        General Comments: Pt reports feeling very tired "Like I just walked 10 miles."   General Comments  no new concerns; pt reports history of vertigo; pt experienceing an asymptomatic othostatic drop after sit<>stand, recovered after 2 minutes of standing     Home Living Family/patient expects to be discharged to:: Private residence Living Arrangements: Children Available Help at Discharge: Available 24 hours/day Type of Home: Apartment Home Access: Level entry     Home Layout: One level     Bathroom Shower/Tub: Tub/shower unit;Walk-in shower   Bathroom Toilet: Handicapped height Bathroom Accessibility: Yes How Accessible: Accessible via walker Home Equipment: Grab bars - toilet;Grab bars - tub/shower;Tub bench   Additional Comments: Pt's son and daughter in law are very supportive      Prior Functioning/Environment Level of Independence: Independent        Comments: 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        OT Problem List: Decreased activity tolerance;Decreased safety awareness      OT Treatment/Interventions:      OT Goals(Current goals can be found in the care plan section) Acute Rehab OT Goals Patient Stated Goal: to figure out why she is blacking out OT Goal Formulation: With patient      AM-PAC OT "6 Clicks" Daily Activity     Outcome Measure Help from another person eating meals?: None Help from another person taking care of personal grooming?: None Help from another person toileting, which includes using toliet, bedpan, or urinal?: None  Help from another person bathing (including washing, rinsing, drying)?: None Help from another person to put on and taking  off regular upper body clothing?: None Help from another person to put on and taking off regular lower body clothing?: None 6 Click Score: 24   End of Session Nurse Communication: Mobility status  Activity Tolerance: Patient tolerated treatment well Patient left: in bed;with call bell/phone within reach  OT Visit Diagnosis: Other abnormalities of gait and mobility (R26.89)                Time: 6222-9798 OT Time Calculation (min): 30 min Charges:  OT General Charges $OT Visit: 1 Visit OT Evaluation $OT Eval Low Complexity: 1 Low OT Treatments $Self Care/Home Management : 8-22 mins    Meyer Dockery A Onyx Edgley 07/04/2020, 9:32 AM

## 2020-07-04 NOTE — Progress Notes (Signed)
  Echocardiogram 2D Echocardiogram has been performed.  Jill Golden 07/04/2020, 3:57 PM

## 2020-07-04 NOTE — Care Management Obs Status (Signed)
MEDICARE OBSERVATION STATUS NOTIFICATION   Patient Details  Name: Jill Golden MRN: [id number] Date of Birth: 1940/08/07   Medicare Observation Status Notification Given:  Yes    Bess Kinds, RN 07/04/2020, 6:06 PM

## 2020-07-04 NOTE — Progress Notes (Signed)
   Subjective:   Reports feeling good this morning, no episodes of dizziness today. Has been able to get out of bed with help from RN.  Discussed plan for today to obtain ECHO and orthostatics, she agrees. She is anxious and scared as she does not want to randomly faint again.  Objective:  Vital signs in last 24 hours: Vitals:   07/04/20 0452 07/04/20 1019 07/04/20 1629 07/04/20 2049  BP: 134/70 125/70 131/71 (!) 150/70  Pulse: 83 69 72 65  Resp: 18 18 16 17   Temp: 98 F (36.7 C) 98.9 F (37.2 C) 98.8 F (37.1 C) 98.7 F (37.1 C)  TempSrc: Oral Oral Oral Oral  SpO2: 96% 99% 99% 97%  Weight:      Height:       Physical Exam: General: elderly female, lying in bed, NAD. CV: normal rate and regular rhythm, no m/r/g. Pulm: CTABL MSK: no peripheral edema Neuro: AAOx4, no focal deficits. Skin: warm and dry.  BMP Latest Ref Rng & Units 07/04/2020 07/03/2020 03/16/2020  Glucose 70 - 99 mg/dL 05/16/2020) 093(A) 98  BUN 8 - 23 mg/dL 12 14 9   Creatinine 0.44 - 1.00 mg/dL 355(D) ) 3.22(G)  BUN/Creat Ratio 12 - 28 - - 7(L)  Sodium 135 - 145 mmol/L 140 139 142  Potassium 3.5 - 5.1 mmol/L 3.4(L) 3.9 4.3  Chloride 98 - 111 mmol/L 111 109 104  CO2 22 - 32 mmol/L 24 23 21   Calcium 8.9 - 10.3 mg/dL 9.0 9.0 2.54(Y   Iron/TIBC/Ferritin/ %Sat    Component Value Date/Time   IRON 32 07/04/2020 0234   TIBC 357 07/04/2020 0234   FERRITIN 7 (L) 07/04/2020 0234   IRONPCTSAT 9 (L) 07/04/2020 0234     Assessment/Plan:  Active Problems:   Syncope  Syncope Patient's symptoms likely 2/2 orthostasis from dehydration. Both prior episodes appear to be in the heat and with decreased PO intake.  Patient initially had positive orthostatic vitals this morning, received fluid rehydration after, and had repeat orthostatics with significant improvement. ECHO showing LVEF 60-65% with G1DD and moderately dilated LA, but no regional wall motion abnormalities and normal RV systolic function and pulmonary  artery systolic pressure. Thyroid studies wnl. Patient is medically stable for discharge but is very anxious and thus would like to stay overnight. Anticipated discharge tomorrow. -LR @100cc  for 5 hours -cardiac monitoring -repeat orthostatics tomorrow AM -PT recommending outpatient PT for continued monitoring of activity tolerance  AKI on CKD II - resolved Renal function with significant improvement after fluid rehydration. Cr 1.04. -LR @100cc  for 5 hours  Iron deficiency anemia Patient with low ferritin and low sats, consistent with IDA. Will replenish with oral iron now and at discharge. -started ferrous sulfate 325mg  daily  HTN HLD Resumed home norvasc and pravastatin. Will resume home losartan tomorrow.   CAD s/p CABG HFpEF Patient with history of HFpEF, but no prior ECHO to compare today's with. See above for today's ECHO findings. -continue ASA 81mg    Prior to Admission Living Arrangement: Home Anticipated Discharge Location: Home Barriers to Discharge: continued medical management Dispo: Anticipated discharge in approximately 0-1 day(s).   07/06/2020, MD 07/04/2020, 9:11 PM Pager: [phone] After 5pm on weekdays and 1pm on weekends: On Call pager [phone]

## 2020-07-04 NOTE — Plan of Care (Signed)
  Problem: Clinical Measurements: Goal: Ability to maintain clinical measurements within normal limits will improve Outcome: Progressing   

## 2020-07-04 NOTE — Progress Notes (Signed)
Physical Therapy Treatment Note  Clinical Impression: Eval complete (full PT note to follow);   Focused on serial BPs and activity tolerance;  Regular Orthostatic BPs (from supine to 3 min standing) overall WFL, without symptoms that would typically be considered presyncopal:    07/04/20 1300  Vital Signs  Patient Position (if appropriate) Orthostatic Vitals  Orthostatic Lying   BP- Lying 138/75  Pulse- Lying 77 (map 95)  Orthostatic Sitting  BP- Sitting 139/79  Pulse- Sitting 88 (map97)  Orthostatic Standing at 0 minutes  BP- Standing at 0 minutes 130/80  Pulse- Standing at 0 minutes 89 (map 94)  Orthostatic Standing at 3 minutes  BP- Standing at 3 minutes 128/78  Pulse- Standing at 3 minutes 90 (map 93)    Opted to observe BPs and symptoms with longer standing and activity times, and pt reported a "fuzzy" feeling similar to the syncopal episodes after approx 10-15 minutes of static standing; we discussed how a typical BP response to physical activity is for it to increase, and opted to walk in hallway with close observation of symptoms; described significant fatigue after approx 50 ft, but able to walk back to the room (for a total of approx 174ft, plus or minus 15) with min (arm over shoulder) assist, and stand for 2 more BPs, but also describing profound fatigue, and hot, fuzzy feeling;     07/04/20 1300 07/04/20 1339 07/04/20 1345  Orthostatic Standing at 3 minutes  BP- Standing at 3 minutes 128/78 120/79 129/83  Pulse- Standing at 3 minutes 90 (map 93) 105 (91 map) 109 (map 95)    07/04/20 1346 07/04/20 1349 07/04/20 1351  Orthostatic Standing at 3 minutes  BP- Standing at 3 minutes 119/82 (fuzzy feeling) 135/80 (after walking in hallway; VERY fatifued) 129/82 (fatigued, hot, fuzzy)  Pulse- Standing at 3 minutes 102 (map94) 117 (map 97) 107 (97)   Overall, notable for SBP drop of 20 mmHg concurrent with initial "fuzzy" feeling with 15-20 minutes of static  standing; HR incr of 40 bpm with amb, a higher increase of what I would normally expect with uncomplicated amb (incr of 20-25 bpm), and significant fatigue as well as hot, "fuzzy"' feeling after uncomplicated amb which approximated household distances; I posit that the energy it took her body to re-group for amb after her BP drifted slowly lower with static standing lead to her extreme fatigue;   Worth considering ordering TED hose to see if they have an effect on standing and upright activity tolerance;   Recommend Outpt PT follow up for close monitor of BP response and activity tolerance with upright activity and walking; Perhaps Outpt PT can facilitate Ms. Lowell Guitar in starting a walking/exercise program safely;   Van Clines, PT  Acute Rehabilitation Services Pager [phone] Office [phone]

## 2020-07-04 NOTE — Discharge Summary (Signed)
Name: Jill Golden MRN: [id number] DOB: 05/04/1940 80 y.o. PCP: Jill Aschoff, MD  Date of Admission: 07/03/2020  2:59 PM Date of Discharge: 07/05/2020 Attending Physician: Jill Golden, *  Discharge Diagnosis: 1. Syncope 2/2 orthostatic hypotension 2/2 dehydration  2. Iron deficiency anemia  3. AKI on CKD II - resolved  Discharge Medications: Allergies as of 07/05/2020       Reactions   Atenolol Other (See Comments)   June 2017: syncopal episode 2/2 bradycardia [[facility]]   Penicillins Shortness Of Breath   Kiwi Extract Other (See Comments)   "Scratches my throat"- no shortness of breath mentioned, though   Lisinopril Cough        Medication List     TAKE these medications    amLODipine 10 MG tablet Commonly known as: NORVASC Take 1 tablet by mouth once daily What changed: when to take this   aspirin EC 81 MG tablet Take 81 mg by mouth in the morning.   Euthyrox 50 MCG tablet Generic drug: levothyroxine TAKE 1 TABLET BY MOUTH ONCE DAILY BEFORE BREAKFAST What changed: how much to take   ferrous sulfate 325 (65 FE) MG tablet Take 1 tablet (325 mg total) by mouth daily with breakfast.   losartan 100 MG tablet Commonly known as: COZAAR Take 1 tablet by mouth once daily What changed: when to take this   pravastatin 40 MG tablet Commonly known as: PRAVACHOL Take 1 tablet by mouth once daily        Disposition and follow-up:   [name] was discharged from [facility] in Stable condition.  At the hospital follow up visit please address:  1. Syncope 2/2 orthostatic hypotension 2/2 dehydration. PT is recommending outpatient PT for continued monitoring of activity tolerance and facilitation of independence with safe walking and exercise program.  We will have patient follow closely in the internal medicine clinic.  2. Iron deficiency anemia. Received IV iron 250 mg x 2.  Will likely need 1 more IV iron  transfusion as outpatient.  Will discharge with oral ferrous sulfate daily.  3. AKI on CKD II - resolved.  Renal function back at baseline after IV fluid rehydration.  2.  Labs / imaging needed at time of follow-up: BMP  3.  Pending labs/ test needing follow-up: None  Follow-up Appointments:   Hospital Course by problem list: 1. Syncope 2/2 orthostatic hypotension 2/2 dehydration.  Patient presented after 2 syncopal episodes within the last 2 weeks.  First episode was walking to her car on a hot day after graduation and second episode was day prior to admission when she was walking at an outdoor shopping mall in the heat.  Both times she suddenly felt hot and a bit dizzy before having a fainting spell, however she was not noted to be confused after.  Arrived to the ED and received some IV fluids.  No arrhythmia or ST changes noted on EKG or telemetry.  Symptoms appear to be secondary to orthostasis from dehydration.  She initially had positive orthostatics but after receiving fluid rehydration, repeat orthostatics with significant improvement.  Echo showing LVEF 60 to 65% with grade 1 diastolic dysfunction moderately dilated LA, but no regional wall motion abnormalities or PFO and normal right ventricular systolic function pulmonary artery systolic pressure.  Thyroid studies within normal limits.  Patient was able to work with physical therapy without difficulty.  She does have good family support at home with 4 sons who look after her.  PT is recommending outpatient PT for continued monitoring of activity tolerance and facilitation of independence with safe walking and exercise program.  We will have patient follow closely in the internal medicine clinic.  2. Iron deficiency anemia.  CBC with hemoglobin 9.8 and MCV 74.8, which was concerning for microcytic anemia.  Iron studies showing ferritin 7, sats 9% which is consistent with iron deficiency anemia.  Patient received 1 dose of oral ferrous  sulfate. Ganzoni equation showing iron deficit to be between 900 to 1100 mg.  Today, she is receiving IV iron 250 mg for 2 doses.  She will likely require 1 more transfusion as an outpatient.  She will follow closely in the internal medicine clinic.  Will discharge with oral ferrous sulfate daily.   3. AKI on CKD II - resolved.  History of CKD stage II with baseline 1.29.  On admission, creatinine 1.51 likely in the setting of decreased oral intake.  Urinalysis negative for acute infection.  Received fluid rehydration with significant improvement in renal function with creatinine now 1.04.   Subjective:  Ms. Jill Golden reports feeling better today.  States that she was able to move around without really feeling dizzy.  Was feeling somewhat warm and fuzzy, but otherwise felt fine.  Discussed plan for discharge today, patient agrees.  Discussed close follow-up with primary care doctor.  Discharge Exam:   BP 132/67   Pulse (!) 59   Temp 98.6 F (37 C) (Oral)   Resp 18   Ht 5\' 8"  (1.727 m)   Wt 81 kg   LMP 05/09/1982   SpO2 98%   BMI 27.15 kg/m  Discharge exam:  General: Pleasant elderly female, lying in bed, NAD. CV: Normal rate and regular rhythm, no murmurs rubs or gallops. Pulm: Clear to auscultation bilaterally, no adventitious sounds noted. MSK: No peripheral edema noted. Neuro: AAOx4, no focal deficits noted. Skin: Warm and dry. Psych: Calm and cooperative.   Pertinent Labs, Studies, and Procedures:  BMP Latest Ref Rng & Units 07/05/2020 07/04/2020 07/03/2020  Glucose 70 - 99 mg/dL 89 07/05/2020) 132(G)  BUN 8 - 23 mg/dL 10 12 14   Creatinine 0.44 - 1.00 mg/dL 401(U) ) 2.72(Z)  BUN/Creat Ratio 12 - 28 - - -  Sodium 135 - 145 mmol/L 140 140 139  Potassium 3.5 - 5.1 mmol/L 3.7 3.4(L) 3.9  Chloride 98 - 111 mmol/L 110 111 109  CO2 22 - 32 mmol/L 24 24 23   Calcium 8.9 - 10.3 mg/dL 9.1 9.0 9.0   CBC Latest Ref Rng & Units 07/04/2020 07/03/2020 10/10/2018  WBC 4.0 - 10.5 K/uL  11.7(H) 10.5 8.7  Hemoglobin 12.0 - 15.0 g/dL 07/06/2020) 10.3(L) 12.5  Hematocrit 36.0 - 46.0 % 30.5(L) 33.2(L) 39.7  Platelets 150 - 400 K/uL 305 321 261   Iron/TIBC/Ferritin/ %Sat    Component Value Date/Time   IRON 32 07/04/2020 0234   TIBC 357 07/04/2020 0234   FERRITIN 7 (L) 07/04/2020 0234   IRONPCTSAT 9 (L) 07/04/2020 0234   Lab Results  Component Value Date   TSH 2.215 07/03/2020   FREET4 0.81 07/04/2020   Urinalysis    Component Value Date/Time   COLORURINE STRAW (A) 07/03/2020 1538   APPEARANCEUR CLEAR 07/03/2020 1538   LABSPEC 1.006 07/03/2020 1538   PHURINE 7.0 07/03/2020 1538   GLUCOSEU NEGATIVE 07/03/2020 1538   HGBUR NEGATIVE 07/03/2020 1538   BILIRUBINUR NEGATIVE 07/03/2020 1538   KETONESUR NEGATIVE 07/03/2020 1538   PROTEINUR NEGATIVE 07/03/2020 1538   UROBILINOGEN 0.2 04/21/2010  1459   NITRITE NEGATIVE 07/03/2020 1538   LEUKOCYTESUR NEGATIVE 07/03/2020 1538   ECHOCARDIOGRAM COMPLETE  Result Date: 07/04/2020    ECHOCARDIOGRAM REPORT   Patient Name:   Jill Golden Date of Exam: 07/04/2020 Medical Rec #:  [id number]         Height:       68.0 in Accession #:    [id number]        Weight:       178.6 lb Date of Birth:  11-25-1940         BSA:          1.948 m Patient Age:    80 years          BP:           125/70 mmHg Patient Gender: F                 HR:           69 bpm. Exam Location:  Inpatient Procedure: 2D Echo, Cardiac Doppler and Color Doppler Indications:    Syncope  History:        Patient has no prior history of Echocardiogram examinations.                 Prior CABG; Risk Factors:Hypertension and Dyslipidemia.  Sonographer:    Mikki Harbororothy Buchanan Referring Phys: [phone] MELANIE BELFI IMPRESSIONS  1. Left ventricular ejection fraction, by estimation, is 60 to 65%. The left ventricle has normal function. The left ventricle has no regional wall motion abnormalities. There is mild left ventricular hypertrophy. Left ventricular diastolic parameters are consistent with  Grade I diastolic dysfunction (impaired relaxation).  2. Right ventricular systolic function is normal. The right ventricular size is normal. There is normal pulmonary artery systolic pressure.  3. Left atrial size was moderately dilated.  4. Right atrial size was mildly dilated.  5. The mitral valve is normal in structure. Trivial mitral valve regurgitation. No evidence of mitral stenosis.  6. Tricuspid valve regurgitation is mild to moderate.  7. The aortic valve is tricuspid. There is mild calcification of the aortic valve. Aortic valve regurgitation is trivial. Mild aortic valve sclerosis is present, with no evidence of aortic valve stenosis.  8. The inferior vena cava is normal in size with greater than 50% respiratory variability, suggesting right atrial pressure of 3 mmHg. FINDINGS  Left Ventricle: Left ventricular ejection fraction, by estimation, is 60 to 65%. The left ventricle has normal function. The left ventricle has no regional wall motion abnormalities. The left ventricular internal cavity size was normal in size. There is  mild left ventricular hypertrophy. Left ventricular diastolic parameters are consistent with Grade I diastolic dysfunction (impaired relaxation). Right Ventricle: The right ventricular size is normal. No increase in right ventricular wall thickness. Right ventricular systolic function is normal. There is normal pulmonary artery systolic pressure. The tricuspid regurgitant velocity is 2.41 m/s, and  with an assumed right atrial pressure of 3 mmHg, the estimated right ventricular systolic pressure is 26.2 mmHg. Left Atrium: Left atrial size was moderately dilated. Right Atrium: Right atrial size was mildly dilated. Pericardium: There is no evidence of pericardial effusion. Mitral Valve: The mitral valve is normal in structure. Trivial mitral valve regurgitation. No evidence of mitral valve stenosis. MV peak gradient, 5.0 mmHg. The mean mitral valve gradient is 2.0 mmHg. Tricuspid  Valve: The tricuspid valve is normal in structure. Tricuspid valve regurgitation is mild to moderate. No evidence of tricuspid stenosis. Aortic Valve: The  aortic valve is tricuspid. There is mild calcification of the aortic valve. Aortic valve regurgitation is trivial. Mild aortic valve sclerosis is present, with no evidence of aortic valve stenosis. Aortic valve mean gradient measures 5.0 mmHg. Aortic valve peak gradient measures 8.4 mmHg. Aortic valve area, by VTI measures 2.82 cm. Pulmonic Valve: The pulmonic valve was normal in structure. Pulmonic valve regurgitation is trivial. No evidence of pulmonic stenosis. Aorta: The aortic root is normal in size and structure. Venous: The inferior vena cava is normal in size with greater than 50% respiratory variability, suggesting right atrial pressure of 3 mmHg. IAS/Shunts: No atrial level shunt detected by color flow Doppler.  LEFT VENTRICLE PLAX 2D LVIDd:         4.00 cm  Diastology LVIDs:         2.50 cm  LV e' medial:    7.40 cm/s LV PW:         1.20 cm  LV E/e' medial:  10.4 LV IVS:        1.20 cm  LV e' lateral:   6.53 cm/s LVOT diam:     2.10 cm  LV E/e' lateral: 11.8 LV SV:         89 LV SV Index:   46 LVOT Area:     3.46 cm  RIGHT VENTRICLE RV Basal diam:  3.30 cm LEFT ATRIUM             Index       RIGHT ATRIUM           Index LA diam:        3.00 cm 1.54 cm/m  RA Area:     23.20 cm LA Vol (A2C):   74.2 ml 38.10 ml/m RA Volume:   70.80 ml  36.35 ml/m LA Vol (A4C):   76.5 ml 39.28 ml/m LA Biplane Vol: 76.9 ml 39.48 ml/m  AORTIC VALVE AV Area (Vmax):    2.77 cm AV Area (Vmean):   2.63 cm AV Area (VTI):     2.82 cm AV Vmax:           145.00 cm/s AV Vmean:          97.700 cm/s AV VTI:            0.314 m AV Peak Grad:      8.4 mmHg AV Mean Grad:      5.0 mmHg LVOT Vmax:         116.00 cm/s LVOT Vmean:        74.100 cm/s LVOT VTI:          0.256 m LVOT/AV VTI ratio: 0.82  AORTA Ao Root diam: 3.10 cm MITRAL VALVE                TRICUSPID VALVE MV Area  (PHT): 2.28 cm     TR Peak grad:   23.2 mmHg MV Area VTI:   2.47 cm     TR Vmax:        241.00 cm/s MV Peak grad:  5.0 mmHg MV Mean grad:  2.0 mmHg     SHUNTS MV Vmax:       1.12 m/s     Systemic VTI:  0.26 m MV Vmean:      59.5 cm/s    Systemic Diam: 2.10 cm MV Decel Time: 333 msec MV E velocity: 77.10 cm/s MV A velocity: 103.00 cm/s MV E/A ratio:  0.75 Arvilla Meres MD Electronically signed by Arvilla Meres MD  Signature Date/Time: 07/04/2020/5:07:42 PM    Final       Discharge Instructions: Discharge Instructions     (HEART FAILURE PATIENTS) Call MD:  Anytime you have any of the following symptoms: 1) 3 pound weight gain in 24 hours or 5 pounds in 1 week 2) shortness of breath, with or without a dry hacking cough 3) swelling in the hands, feet or stomach 4) if you have to sleep on extra pillows at night in order to breathe.   Complete by: As directed    Call MD for:  difficulty breathing, headache or visual disturbances   Complete by: As directed    Call MD for:  extreme fatigue   Complete by: As directed    Call MD for:  persistant dizziness or light-headedness   Complete by: As directed    Call MD for:  temperature >100.4   Complete by: As directed    Diet - low sodium heart healthy   Complete by: As directed    Discharge instructions   Complete by: As directed    Ms. Jill Golden, it was a pleasure taking care of you during your stay here.  You came in after 2 fainting episodes over the past couple weeks.  We believe that these fainting episodes were from being out in the heat and likely being a little dehydrated.  Please take note of the following: 1.  Please make sure to stay well-hydrated and avoid staying out in the sun for too long.  2.  Please follow-up in the internal medicine clinic in 1 to 2 weeks.  Someone will call you to schedule this appointment.  3.  I have sent a prescription of iron tablets to the [facility] on [address].  Take 1 tablet daily to replenish your  low iron.  4.  I have also sent a referral for physical therapy as an outpatient to help with mobility.  Someone will call you to inform you more about this.   Increase activity slowly   Complete by: As directed        Signed: Merrilyn Puma, MD 07/05/2020, 12:55 PM   Pager: [phone]

## 2020-07-05 ENCOUNTER — Other Ambulatory Visit: Payer: Self-pay | Admitting: Student

## 2020-07-05 DIAGNOSIS — D649 Anemia, unspecified: Secondary | ICD-10-CM

## 2020-07-05 DIAGNOSIS — T671XXA Heat syncope, initial encounter: Secondary | ICD-10-CM

## 2020-07-05 LAB — BASIC METABOLIC PANEL
Anion gap: 6 (ref 5–15)
BUN: 10 mg/dL (ref 8–23)
CO2: 24 mmol/L (ref 22–32)
Calcium: 9.1 mg/dL (ref 8.9–10.3)
Chloride: 110 mmol/L (ref 98–111)
Creatinine, Ser: 1.04 mg/dL — ABNORMAL HIGH (ref 0.44–1.00)
GFR, Estimated: 54 mL/min — ABNORMAL LOW (ref >60)
Glucose, Bld: 89 mg/dL (ref 70–99)
Potassium: 3.7 mmol/L (ref 3.5–5.1)
Sodium: 140 mmol/L (ref 135–145)

## 2020-07-05 LAB — GLUCOSE, CAPILLARY: Glucose-Capillary: 105 mg/dL — ABNORMAL HIGH (ref 70–99)

## 2020-07-05 MED ORDER — SODIUM CHLORIDE 0.9 % IV SOLN
250.0000 mg | Freq: Every day | INTRAVENOUS | Status: DC
  Administered 2020-07-05: 250 mg via INTRAVENOUS
  Filled 2020-07-05: qty 20

## 2020-07-05 MED ORDER — SODIUM CHLORIDE 0.9 % IV SOLN
250.0000 mg | Freq: Once | INTRAVENOUS | Status: AC
  Administered 2020-07-05: 250 mg via INTRAVENOUS
  Filled 2020-07-05: qty 20

## 2020-07-05 MED ORDER — SODIUM CHLORIDE 0.9 % IV SOLN: 510.0000 mg | Freq: Once | INTRAVENOUS | Status: DC

## 2020-07-05 MED ORDER — FERROUS SULFATE 325 (65 FE) MG PO TABS: 325.0000 mg | ORAL_TABLET | Freq: Every day | ORAL | 1 refills | Status: DC

## 2020-07-05 NOTE — Hospital Course (Signed)
Jill Golden was examined and evaluated at bedside this am. She mentions having some blurry vision but otherwise feels back to baseline. Mentions also feeling very 'hot' Discussed plan to follow up PT/OT and discharge. Jill Golden expresse dunderstanding.

## 2020-07-05 NOTE — Progress Notes (Signed)
Ok to change Feraheme to Ferrlecit due to formulary reason per Dr. Austin Miles.  Ferrlecit 250mg  IV qday x2  , PharmD, BCIDP, AAHIVP, CPP Infectious Disease Pharmacist 07/05/2020 8:41 AM

## 2020-07-05 NOTE — Progress Notes (Signed)
Physical Therapy Treatment Patient Details Name: Jill Golden MRN: [id number] DOB: 18-Jun-1940 Today's Date: 07/05/2020    History of Present Illness [name] is a 80 year old female presenting after a syncopal episode. [place] she suddenly felt very hot and faint. Following this, she had a syncopal episode at which time she was lowered to the ground. She did not fall or hit her head. She had a brief loss of consciousness. She reports feeling "fuzzy-headed and hot" prior to the episode;  with PMHx of CAD s/p CABG in 1996, diabetes mellitus, hypertension, hyperlipidemia, BPPV, HFpEF, and hypothyroidism    PT Comments    Continuing work on functional mobility and activity tolerance;  Excellent improvement in activity tolerance compared to yesterday, able to double amb distance, and had no reports of "fuzzy" feeling, fatigue, or feeling warm/hot;  BPs, vitals as follows:     07/05/20 1120  Orthostatic Lying   BP- Lying 134/73  Pulse- Lying 65 (map 92)  Orthostatic Sitting  BP- Sitting 133/84  Pulse- Sitting 80 (map 97)  Orthostatic Standing at 0 minutes  BP- Standing at 0 minutes 137/79  Pulse- Standing at 0 minutes 92 (map 95)  Orthostatic Standing at 3 minutes  BP- Standing at 3 minutes 138/78  Pulse- Standing at 3 minutes 92 (map 97)      07/05/20 1120 07/05/20 1130 07/05/20 1145  Orthostatic Standing   BP- Standing at 3 minutes 138/78  --  148/88 (standing after progressive hallway amb)  Pulse- Standing at 3 minutes 92 (map 97) 122 (During amb, HR ranged 110s to 120s, HR Max 122 per Central Tele Monitoring) 108 (map 105)    Follow Up Recommendations  Outpatient PT;Other (comment) (for continued monitoring of activity tolerance and faciliatation fo independence with safe walking and exercise pgm) It may be easier to set up Cardiac Rehab Phase 2, which would be able to give comparable service     Equipment Recommendations  None  recommended by PT (no need for Rollator)    Recommendations for Other Services       Precautions / Restrictions Precautions Precautions: Fall Precaution Comments: Noted BP drifts down with incr time in standing; reports feeling "fuzzy" and hot when close to fainting (6/27: no reports of "fuzzy" or hot feeling with doubling walking distance, and compression socks on)    Mobility  Bed Mobility Overal bed mobility: Independent                  Transfers Overall transfer level: Modified independent Equipment used: None             General transfer comment: No overt difficulty  Ambulation/Gait Ambulation/Gait assistance: Supervision Gait Distance (Feet): 350 Feet Assistive device: None Gait Pattern/deviations: WFL(Within Functional Limits)     General Gait Details: Cues to self-monitor for activity tolerance; No reports of "fuzzy", dizzy, or warm/hot feeling   Stairs             Wheelchair Mobility    Modified Rankin (Stroke Patients Only)       Balance     Sitting balance-Leahy Scale: Good       Standing balance-Leahy Scale: Good                              Cognition Arousal/Alertness: Awake/alert Behavior During Therapy: WFL for tasks assessed/performed Overall Cognitive Status: Within Functional Limits for tasks assessed  Exercises      General Comments General comments (skin integrity, edema, etc.): Pt's change in HR is worth noting, with an increase from sinus 60s to 80s at rest to 110s to 120s; no reports of presyncopal symptoms      Pertinent Vitals/Pain Pain Assessment: No/denies pain Pain Intervention(s): Monitored during session    Home Living                      Prior Function            PT Goals (current goals can now be found in the care plan section) Acute Rehab PT Goals Patient Stated Goal: to figure out why she is blacking out PT Goal  Formulation: With patient Time For Goal Achievement: 07/18/20 Potential to Achieve Goals: Good Progress towards PT goals: Progressing toward goals    Frequency    Min 3X/week      PT Plan Current plan remains appropriate    Co-evaluation              AM-PAC PT "6 Clicks" Mobility   Outcome Measure  Help needed turning from your back to your side while in a flat bed without using bedrails?: None Help needed moving from lying on your back to sitting on the side of a flat bed without using bedrails?: None Help needed moving to and from a bed to a chair (including a wheelchair)?: None Help needed standing up from a chair using your arms (e.g., wheelchair or bedside chair)?: None Help needed to walk in hospital room?: None Help needed climbing 3-5 steps with a railing? : None 6 Click Score: 24    End of Session   Activity Tolerance: Patient tolerated treatment well Patient left: in chair;with call bell/phone within reach Nurse Communication: Mobility status;Other (comment) (back in room) PT Visit Diagnosis: Other abnormalities of gait and mobility (R26.89)     Time: 5638-9373 PT Time Calculation (min) (ACUTE ONLY): 35 min  Charges:  $Gait Training: 23-37 mins                     Van Clines, [place]  Acute Rehabilitation Services Pager [phone] Office [phone]    Levi Aland 07/05/2020, 12:38 PM

## 2020-07-05 NOTE — Progress Notes (Signed)
DISCHARGE NOTE HOME Jill Golden to be discharged Home per MD order. Discussed prescriptions and follow up appointments with the patient. Prescriptions given to patient; medication list explained in detail. Patient verbalized understanding.  Skin clean, dry and intact without evidence of skin break down, no evidence of skin tears noted. IV catheter discontinued intact. Site without signs and symptoms of complications. Dressing and pressure applied. Pt denies pain at the site currently. No complaints noted.  Patient free of lines, drains, and wounds.   An After Visit Summary (AVS) was printed and given to the patient. Patient escorted via wheelchair, and discharged home via private auto.  Myrtis Hopping, RN

## 2020-07-06 ENCOUNTER — Telehealth: Payer: Self-pay | Admitting: Internal Medicine

## 2020-07-06 LAB — T3: T3, Total: 72 ng/dL (ref 71–180)

## 2020-07-06 NOTE — Telephone Encounter (Signed)
TOC HFU APPT  Date: 07/19/2020 Status: Sch  Time: 2:45 PM Length: 30       Provider: Verdene Lennert, MD Department: [facility]       Notes: HFU/PER DR Drema Balzarine

## 2020-07-19 ENCOUNTER — Other Ambulatory Visit: Payer: Self-pay

## 2020-07-19 ENCOUNTER — Encounter: Payer: Self-pay | Admitting: Internal Medicine

## 2020-07-19 ENCOUNTER — Ambulatory Visit (INDEPENDENT_AMBULATORY_CARE_PROVIDER_SITE_OTHER): Payer: Medicare Other | Admitting: Internal Medicine

## 2020-07-19 VITALS — BP 130/70 | HR 77 | Temp 98.1°F | Ht 68.0 in | Wt 178.2 lb

## 2020-07-19 DIAGNOSIS — I10 Essential (primary) hypertension: Secondary | ICD-10-CM | POA: Diagnosis not present

## 2020-07-19 DIAGNOSIS — D509 Iron deficiency anemia, unspecified: Secondary | ICD-10-CM

## 2020-07-19 DIAGNOSIS — R55 Syncope and collapse: Secondary | ICD-10-CM | POA: Diagnosis not present

## 2020-07-19 NOTE — Progress Notes (Signed)
Patient opted to leave and to reschedule appointment.  Son accompanying patient.  Patient was ok to reschedule as she had waited in room for appointment

## 2020-07-19 NOTE — Progress Notes (Signed)
   CC: Iron deficiency anemia   HPI:  Jill Golden is a 80 y.o. with a PMHx as listed below who presents to the clinic for iron deficiency anemia.   Please see the Encounters tab for problem-based Assessment & Plan regarding status of patient's acute and chronic conditions.  Past Medical History:  Diagnosis Date   Anxiety    BPPV (benign paroxysmal positional vertigo)    CAD (coronary artery disease)    4 stents placed prior to CABAG (1996 ? ) , s/p CABG 1996    Diabetes mellitus    Diabetes mellitus, type 2 (HCC) 05/09/2010   Dizziness    Hypertension    Hypothyroid    Review of Systems: Review of Systems  Constitutional:  Positive for malaise/fatigue. Negative for weight loss.  Respiratory:  Negative for shortness of breath and wheezing.   Cardiovascular:  Negative for chest pain and palpitations.  Neurological:  Negative for dizziness, focal weakness, loss of consciousness, weakness and headaches.   Physical Exam:  Vitals:   07/19/20 1351 07/19/20 1358  BP: (!) 144/78 130/70  Pulse: 85 77  Temp: 98.1 F (36.7 C)   TempSrc: Oral   SpO2: 100%   Weight: 178 lb 3.2 oz (80.8 kg)   Height: 5\' 8"  (1.727 m)    Physical Exam Vitals and nursing note reviewed.  Constitutional:      General: She is not in acute distress.    Appearance: She is normal weight.  Pulmonary:     Effort: Pulmonary effort is normal. No respiratory distress.  Skin:    General: Skin is warm and dry.  Neurological:     General: No focal deficit present.     Mental Status: She is alert and oriented to person, place, and time. Mental status is at baseline.     Gait: Gait normal.  Psychiatric:        Mood and Affect: Mood normal.        Behavior: Behavior normal.    Assessment & Plan:   See Encounters Tab for problem based charting.  Patient discussed with Dr. 

## 2020-07-20 NOTE — Assessment & Plan Note (Signed)
Jill Golden states she was recently admitted to the hospital for syncope that was felt to be due to orthostatic hypotension in the setting of dehydration.  No additional episodes of syncope since discharge from the hospital.  She has been making sure she drinks plenty of water and avoids dehydrating drinks.  Assessment/plan: No additional intervention at this time.  Counseled patient on the importance of staying hydrated, especially when outside in high heat

## 2020-07-20 NOTE — Assessment & Plan Note (Signed)
Jill Golden was recently admitted to the hospital for syncope and incidentally discovered to have iron deficiency anemia.  She received one-time dose of Feraheme and was started on oral ferrous sulfate supplementation.  She is here today for follow-up  Jill Golden denies any acute complaints at this time other than some fatigue whenever washing dishes.  Specifically, she denies dizziness, palpitations, shortness of breath.  We discussed possible sources of iron deficiency anemia including lack of adequate dietary intake versus chronic blood loss.  She denies any vaginal bleeding, hematochezia, melena.  We discussed that usually at this point we would refer her to GI for a EGD and colonoscopy.  Jill Golden states she has lived a good 80 years and would not want any surgical intervention.  We discover that worse case scenario, IDA can indicate underlying malignancy.  Jill Golden states she would not want any intervention if that was the case.  At this time, Jill Golden would prefer that we closely monitor and hold off on any aggressive intervention.  Assessment/plan: Moderate iron deficiency anemia that has developed over the past 2 years, as last CBC was in 2020.  No obvious sources of bleeding at this time.  Patient is tolerating oral supplementation without any difficulty.  - Continue ferrous sulfate daily - Repeat ferritin, iron panel in 4 to 6 weeks

## 2020-07-20 NOTE — Assessment & Plan Note (Signed)
BP: 130/70   Blood pressure at goal at this time.  -Continue amlodipine 10 mg daily

## 2020-07-27 ENCOUNTER — Ambulatory Visit: Payer: Medicare Other | Admitting: Physical Therapy

## 2020-07-27 NOTE — Progress Notes (Signed)
Internal Medicine Clinic Attending  Case discussed with Dr. Basaraba  At the time of the visit.  We reviewed the resident's history and exam and pertinent patient test results.  I agree with the assessment, diagnosis, and plan of care documented in the resident's note.  

## 2020-07-28 ENCOUNTER — Encounter: Payer: Medicare Other | Admitting: Internal Medicine

## 2020-08-04 ENCOUNTER — Ambulatory Visit (INDEPENDENT_AMBULATORY_CARE_PROVIDER_SITE_OTHER): Payer: Medicare Other | Admitting: Internal Medicine

## 2020-08-04 ENCOUNTER — Encounter: Payer: Self-pay | Admitting: Internal Medicine

## 2020-08-04 VITALS — BP 145/78 | HR 77 | Wt 178.9 lb

## 2020-08-04 DIAGNOSIS — D509 Iron deficiency anemia, unspecified: Secondary | ICD-10-CM

## 2020-08-05 LAB — CBC
Hematocrit: 37.5 % (ref 34.0–46.6)
Hemoglobin: 11.9 g/dL (ref 11.1–15.9)
MCH: 24.7 pg — ABNORMAL LOW (ref 26.6–33.0)
MCHC: 31.7 g/dL (ref 31.5–35.7)
MCV: 78 fL — ABNORMAL LOW (ref 79–97)
Platelets: 316 10*3/uL (ref 150–450)
RBC: 4.81 x10E6/uL (ref 3.77–5.28)
RDW: 20.4 % — ABNORMAL HIGH (ref 11.7–15.4)
WBC: 9.3 10*3/uL (ref 3.4–10.8)

## 2020-08-05 LAB — FERRITIN: Ferritin: 84 ng/mL (ref 15–150)

## 2020-08-05 NOTE — Progress Notes (Signed)
   CC: Iron Deficiency anemia  HPI:  [name] is a 80 y.o. with a PMHx as listed below who presents to the clinic for iron deficiency anemia.   Please see the Encounters tab for problem-based Assessment & Plan regarding status of patient's acute and chronic conditions.  Past Medical History:  Diagnosis Date   Anxiety    BPPV (benign paroxysmal positional vertigo)    CAD (coronary artery disease)    4 stents placed prior to CABAG (1996 ? ) , s/p CABG 1996    Diabetes mellitus    Diabetes mellitus, type 2 (HCC) 05/09/2010   Dizziness    Hypertension    Hypothyroid    Review of Systems: Review of Systems  Constitutional:  Negative for chills, fever, malaise/fatigue and weight loss.  Respiratory:  Negative for cough, shortness of breath and wheezing.   Cardiovascular:  Negative for chest pain, palpitations, orthopnea and leg swelling.  Gastrointestinal:  Positive for diarrhea (one time, resolved). Negative for abdominal pain, blood in stool, constipation, melena, nausea and vomiting.  Neurological:  Negative for dizziness, focal weakness, weakness and headaches.   Physical Exam:  Vitals:   08/04/20 1329  BP: (!) 145/78  Pulse: 77  SpO2: 100%  Weight: 178 lb 14.4 oz (81.1 kg)   Physical Exam Vitals and nursing note reviewed.  Constitutional:      Appearance: She is normal weight.  HENT:     Head: Normocephalic and atraumatic.     Mouth/Throat:     Mouth: Mucous membranes are moist.     Pharynx: Oropharynx is clear.  Cardiovascular:     Rate and Rhythm: Normal rate and regular rhythm.     Heart sounds: No murmur heard. Pulmonary:     Effort: Pulmonary effort is normal. No respiratory distress.     Breath sounds: Normal breath sounds. No wheezing, rhonchi or rales.  Skin:    General: Skin is warm and dry.  Neurological:     General: No focal deficit present.     Mental Status: She is alert and oriented to person, place, and time. Mental status is at  baseline.  Psychiatric:        Mood and Affect: Mood normal.        Behavior: Behavior normal.        Thought Content: Thought content normal.        Judgment: Judgment normal.    Assessment & Plan:   See Encounters Tab for problem based charting.  Patient discussed with Dr. Mayford Knife

## 2020-08-05 NOTE — Assessment & Plan Note (Signed)
Jill Golden is here for follow-up in regards to her iron deficiency anemia.  She continues to take ferrous sulfate daily without any difficulty.  She notes only 1 time of abdominal pain with diarrhea but it self resolved very quickly.  She denies any dizziness, palpitations, chest pain, shortness of breath, burning or tingling in her extremities, melena, hematochezia, hematuria.  Assessment/plan: Will reevaluate patient's CBC and ferritin today.  Counseled that if she continues to have any upset stomach with ferrous sulfate, I would recommend she switch to iron polysaccharide.  Addendum: CBC shows significantly improved hemoglobin that has returned to normal limits.  Ferritin shows improvement as well.  For now, recommend patient continue daily iron supplementation.

## 2020-08-08 NOTE — Progress Notes (Signed)
Internal Medicine Clinic Attending  Case discussed with Dr. Basaraba  At the time of the visit.  We reviewed the resident's history and exam and pertinent patient test results.  I agree with the assessment, diagnosis, and plan of care documented in the resident's note.  

## 2020-08-09 ENCOUNTER — Ambulatory Visit: Payer: Medicare Other | Attending: Internal Medicine | Admitting: Physical Therapy

## 2020-08-18 NOTE — Addendum Note (Signed)
Addended by: Neomia Dear on: 08/18/2020 07:03 PM   Modules accepted: Orders

## 2020-09-07 DIAGNOSIS — Z961 Presence of intraocular lens: Secondary | ICD-10-CM | POA: Diagnosis not present

## 2020-09-07 DIAGNOSIS — H401133 Primary open-angle glaucoma, bilateral, severe stage: Secondary | ICD-10-CM | POA: Diagnosis not present

## 2020-09-07 DIAGNOSIS — H2512 Age-related nuclear cataract, left eye: Secondary | ICD-10-CM | POA: Diagnosis not present

## 2020-09-23 ENCOUNTER — Other Ambulatory Visit: Payer: Self-pay

## 2020-09-23 DIAGNOSIS — E039 Hypothyroidism, unspecified: Secondary | ICD-10-CM

## 2020-09-23 DIAGNOSIS — I10 Essential (primary) hypertension: Secondary | ICD-10-CM

## 2020-09-23 DIAGNOSIS — E785 Hyperlipidemia, unspecified: Secondary | ICD-10-CM

## 2020-09-23 MED ORDER — AMLODIPINE BESYLATE 10 MG PO TABS: 10.0000 mg | ORAL_TABLET | Freq: Every day | ORAL | 3 refills | Status: DC

## 2020-09-23 MED ORDER — PRAVASTATIN SODIUM 40 MG PO TABS: 40.0000 mg | ORAL_TABLET | Freq: Every day | ORAL | 3 refills | Status: DC

## 2020-09-23 MED ORDER — LOSARTAN POTASSIUM 100 MG PO TABS: 100.0000 mg | ORAL_TABLET | Freq: Every day | ORAL | 3 refills | Status: DC

## 2020-09-23 MED ORDER — LEVOTHYROXINE SODIUM 50 MCG PO TABS
50.0000 ug | ORAL_TABLET | Freq: Every day | ORAL | 3 refills | Status: DC
Start: 2020-09-23 — End: 2021-09-20

## 2020-10-24 DIAGNOSIS — H2512 Age-related nuclear cataract, left eye: Secondary | ICD-10-CM | POA: Diagnosis not present

## 2020-10-26 DIAGNOSIS — H2512 Age-related nuclear cataract, left eye: Secondary | ICD-10-CM | POA: Diagnosis not present

## 2020-10-26 DIAGNOSIS — H401133 Primary open-angle glaucoma, bilateral, severe stage: Secondary | ICD-10-CM | POA: Diagnosis not present

## 2020-10-26 DIAGNOSIS — Z961 Presence of intraocular lens: Secondary | ICD-10-CM | POA: Diagnosis not present

## 2020-10-28 DIAGNOSIS — H2512 Age-related nuclear cataract, left eye: Secondary | ICD-10-CM | POA: Diagnosis not present

## 2020-10-28 DIAGNOSIS — H401123 Primary open-angle glaucoma, left eye, severe stage: Secondary | ICD-10-CM | POA: Diagnosis not present

## 2020-11-24 NOTE — Progress Notes (Signed)
Tsh Anemia, iron.  IV Fe during hospitalization summer 2022.   Vit D

## 2020-11-25 ENCOUNTER — Ambulatory Visit (INDEPENDENT_AMBULATORY_CARE_PROVIDER_SITE_OTHER): Payer: Medicare Other | Admitting: Internal Medicine

## 2020-11-25 VITALS — BP 163/83 | HR 86 | Temp 98.2°F | Ht 68.0 in | Wt 175.5 lb

## 2020-11-25 DIAGNOSIS — I5032 Chronic diastolic (congestive) heart failure: Secondary | ICD-10-CM | POA: Diagnosis not present

## 2020-11-25 DIAGNOSIS — I10 Essential (primary) hypertension: Secondary | ICD-10-CM

## 2020-11-25 DIAGNOSIS — Z951 Presence of aortocoronary bypass graft: Secondary | ICD-10-CM

## 2020-11-25 DIAGNOSIS — I130 Hypertensive heart and chronic kidney disease with heart failure and stage 1 through stage 4 chronic kidney disease, or unspecified chronic kidney disease: Secondary | ICD-10-CM | POA: Diagnosis not present

## 2020-11-25 DIAGNOSIS — H409 Unspecified glaucoma: Secondary | ICD-10-CM | POA: Diagnosis not present

## 2020-11-25 DIAGNOSIS — D509 Iron deficiency anemia, unspecified: Secondary | ICD-10-CM | POA: Diagnosis not present

## 2020-11-25 DIAGNOSIS — E039 Hypothyroidism, unspecified: Secondary | ICD-10-CM

## 2020-11-25 DIAGNOSIS — Z23 Encounter for immunization: Secondary | ICD-10-CM

## 2020-11-25 DIAGNOSIS — N1831 Chronic kidney disease, stage 3a: Secondary | ICD-10-CM | POA: Diagnosis not present

## 2020-11-25 DIAGNOSIS — H8110 Benign paroxysmal vertigo, unspecified ear: Secondary | ICD-10-CM

## 2020-11-25 DIAGNOSIS — E785 Hyperlipidemia, unspecified: Secondary | ICD-10-CM | POA: Diagnosis not present

## 2020-11-25 DIAGNOSIS — M109 Gout, unspecified: Secondary | ICD-10-CM | POA: Diagnosis not present

## 2020-11-25 NOTE — Progress Notes (Signed)
80 year old independently living Jill Golden presents today for routine follow-up and to meet her new PCP (me).  She is happy to say that she has been feeling fine "considering what I have had in the past" and has been enjoying the fall weather and her family.  She has 4 sons-2 in [place]; 1 in [place], and 1 in [place].  16 grands, 5 great grands.  She has been widowed since 36.    Typical day: Gets up early, showers, lays on bed til 8:30 am, takes breakfast and meds, doesn't drive (vision).  Son takes her shopping and provides transportation.  [facility] manages her household and cooks independently.  Does own ADLs and IADLs.  Just had glaucoma surgery.     Her only discomfort is upper abdomen:  Feels epigastric fullness, eats and feels full.  No pain.  No dysphagia.  No nausea.  No relflux or acid.  Normal bowel habits.  No melena. Hasn't had a complete meal in a long while.  No significant weight loss.  Drinks water 'all day long". Takes tylenol, no nsaids, no ETOH.    No chest pain since CABG. Gout, last attack 8 mo ago, acidic food triggers.  Occasional steaks.  No ETOH. Flares occur in L big toe up to ankle.   No vertigo in a few years, classic, hospitalized.  NO rehab.     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   BPPV (benign paroxysmal positional vertigo) 05/09/2010      Current Outpatient Medications:    amLODipine (NORVASC) 10 MG tablet, Take 1 tablet (10 mg total) by mouth daily., Disp: 90 tablet, Rfl: 3   aspirin EC 81 MG tablet, Take 81 mg by  mouth in the morning., Disp: , Rfl:    ferrous sulfate 325 (65 FE) MG tablet, Take 1 tablet (325 mg total) by mouth daily with breakfast., Disp: 30 tablet, Rfl: 1   levothyroxine (EUTHYROX) 50 MCG tablet, Take 1 tablet (50 mcg total) by mouth daily before breakfast., Disp: 90 tablet, Rfl: 3   losartan (COZAAR) 100 MG tablet, Take 1 tablet (100 mg total) by mouth daily., Disp: 90 tablet, Rfl: 3   pravastatin (PRAVACHOL) 40 MG tablet, Take 1 tablet (40 mg total) by mouth daily., Disp: 90 tablet, Rfl: 3  Physical Exam BP (!) 163/83 (BP Location: Right Arm, Patient Position: Sitting, Cuff Size: Small)   Pulse 86   Temp 98.2 F (36.8 C) (Oral)   Ht 5\' 8"  (1.727 m)   Wt 175 lb 8 oz (79.6 kg)   LMP 05/09/1982   SpO2 100%   BMI 26.68 kg/m   Youthful appearance, nicely dressed and groomed, enjoyable in conversation. Heart NSR but had a several second period of irregularity, she feels it, and has noticed it in the past "when nervous like today".  It is not associated with  dyspnea or chest tightness.  Lungs are clear to auscultation throughout, no increased work of breathing.  On examination table, there is no distention and abdomen is soft with normal bowel sounds.  An area  of more firmness is located in the epigastric and adjacent left upper quadrant areas-nothing discrete can be palpated, and it is not uncomfortable for her.  No right upper quadrant tenderness and no palpable liver edge.  No lower extremity edema.  Assessment and plan (see also problem based documentation:  Health maintenance:  Agrees to influenza injection today. Has had COVID vaccinations x3 and inquires of my opinion about the fourth injection which I encouraged. Agrees to referral for colonoscopy for cancer screening (negative family history colon cancer) She declines screening mammogram "I just do not want to know".  Sister had cancer in the lung and the breast.  Epigastric discomfort: Early satiety, feeling of fullness,  and firmness in epigastrium warrant investigation.  Will refer to GI for both consideration of endoscopy and colonoscopy for these symptoms as well as history of iron deficiency anemia recognized summer 2022 and as of yet does not have an underlying cause.  Recheck iron studies today.  Health care goal is to maintain independence.  Unacceptable in her mind would be needing her children to participate in her care - becoming dependent would mean "life wouldn't be worth living".  She would prefer to move to a care facility such as an assisted living to avoid this.  She enjoys spending time with her family and is interested in any medical advice and intervention which can help her remain well and physically functional.

## 2020-11-25 NOTE — Patient Instructions (Signed)
Jill Golden, I loved meeting you today and look forward to our next visit.  We discussed many things, including our desire to make sure your iron levels are increasing and that you are now making new red blood cells.  We are also checking your thyroid levels to see if the medicine dose might be causing your heat intolerance.  I will call you with those results.  We also discussed a referral to GI for a colonoscopy and the sensation of fullness and inability to eat a full meal.  Important to get that sorted out, because you are a young independent woman with things to do!  Take care and stay well.   Flu shot today :)  Dr. Mayford Knife

## 2020-11-26 LAB — RETICULOCYTES: Retic Ct Pct: 1 % (ref 0.6–2.6)

## 2020-11-26 LAB — CBC
Hematocrit: 39.7 % (ref 34.0–46.6)
Hemoglobin: 13 g/dL (ref 11.1–15.9)
MCH: 26.4 pg — ABNORMAL LOW (ref 26.6–33.0)
MCHC: 32.7 g/dL (ref 31.5–35.7)
MCV: 81 fL (ref 79–97)
Platelets: 316 10*3/uL (ref 150–450)
RBC: 4.93 x10E6/uL (ref 3.77–5.28)
RDW: 15.8 % — ABNORMAL HIGH (ref 11.7–15.4)
WBC: 8.8 10*3/uL (ref 3.4–10.8)

## 2020-11-26 LAB — IRON AND TIBC
Iron Saturation: 18 % (ref 15–55)
Iron: 55 ug/dL (ref 27–139)
Total Iron Binding Capacity: 300 ug/dL (ref 250–450)
UIBC: 245 ug/dL (ref 118–369)

## 2020-11-26 LAB — TSH: TSH: 2.77 u[IU]/mL (ref 0.450–4.500)

## 2020-11-26 LAB — FERRITIN: Ferritin: 48 ng/mL (ref 15–150)

## 2020-11-29 ENCOUNTER — Encounter: Payer: Self-pay | Admitting: Internal Medicine

## 2020-11-29 NOTE — Assessment & Plan Note (Signed)
Due for recheck of hgb and iron levels.  No hx of melena or bleeding per rectum.  She agrees to colonoscopy referral.

## 2020-11-29 NOTE — Assessment & Plan Note (Signed)
Compensated, asymptomatic, requiring no diuretic.  Monitor.

## 2020-11-29 NOTE — Assessment & Plan Note (Signed)
On statin, though lipid panel is long overdue.  I will catch this at a future visit.

## 2020-11-29 NOTE — Assessment & Plan Note (Signed)
No angina since CABG.  On ASA 81 mg daily, statin, antihypertensives.  Monitor.

## 2020-11-29 NOTE — Assessment & Plan Note (Signed)
Last attack 8 mo ago, acidic food triggers.  Occasional steaks.  No ETOH. Flares occur in L big toe up to ankle.  Resolve w/o intervention.

## 2020-11-29 NOTE — Assessment & Plan Note (Signed)
Last BMP 06/2020 CKD stage IIIa.  This has been stable.

## 2020-11-29 NOTE — Assessment & Plan Note (Addendum)
Due for TSH.  Currently on levothyroxine 50 mcg daily.  She notes cold intolerance and has had some irregular fast heart rates.

## 2020-11-29 NOTE — Assessment & Plan Note (Signed)
Endorses feeling nervous today.  SBP in the 130s at prior visits.  Currently on losartan 100 mg daily and amlodipine 5 mg daily.  We will recheck at next visit.

## 2020-11-29 NOTE — Assessment & Plan Note (Signed)
Asymptomatic  Monitor

## 2020-11-29 NOTE — Assessment & Plan Note (Signed)
Recent glaucoma surgery, followed by Dr. Allena Katz.

## 2020-11-30 ENCOUNTER — Other Ambulatory Visit: Payer: Self-pay | Admitting: Internal Medicine

## 2020-11-30 ENCOUNTER — Telehealth: Payer: Self-pay | Admitting: Internal Medicine

## 2020-11-30 NOTE — Progress Notes (Signed)
error 

## 2020-11-30 NOTE — Telephone Encounter (Signed)
Called Jill Golden to inform her of normal lab results.  SHe may stop her iron supplement.  I can recheck at a future visit.  Referred to GI for colonoscopy.

## 2021-01-12 ENCOUNTER — Telehealth: Payer: Self-pay | Admitting: *Deleted

## 2021-01-12 NOTE — Telephone Encounter (Signed)
Pt called and informed of Dr Mayford Knife' response : "Unfortunately there aren't any really safe OTC medicines for vertigo and if she can manage to wait until she returns for an evaluation, that would be best.  We can renew her Antivert when she gets back. " Stated she will wait until she returns home to call and schedule an apptt.

## 2021-01-12 NOTE — Telephone Encounter (Signed)
Call from pt requesting OTC for vertigo. States she's out of town until Monday; wants to know if there's something she could buy OTC until she gets back home. States she has a hx of vertigo - per chart, she has taken Meclizine before but she prefers OTC if possible otherwise she will wait until she gets home.States it's not "very bad". Inform pt I will ask her doctor.

## 2021-01-19 ENCOUNTER — Ambulatory Visit (INDEPENDENT_AMBULATORY_CARE_PROVIDER_SITE_OTHER): Payer: Medicare Other | Admitting: Internal Medicine

## 2021-01-19 ENCOUNTER — Encounter: Payer: Self-pay | Admitting: Internal Medicine

## 2021-01-19 VITALS — BP 107/67 | HR 83 | Temp 98.8°F | Wt 172.2 lb

## 2021-01-19 DIAGNOSIS — J069 Acute upper respiratory infection, unspecified: Secondary | ICD-10-CM

## 2021-01-19 DIAGNOSIS — I10 Essential (primary) hypertension: Secondary | ICD-10-CM

## 2021-01-19 DIAGNOSIS — H8110 Benign paroxysmal vertigo, unspecified ear: Secondary | ICD-10-CM

## 2021-01-19 NOTE — Patient Instructions (Signed)
Thank you, Ms.Leodis Sias for allowing [place] to provide your care today. Today we discussed:  Cough: It is likely that you had a virus that caused an upper respiratory infection also called a common cold.  Today we tested you for COVID 19.  Please try to isolate until you have the results of the test.  If you test COVID-positive please isolate until next Monday.  Continue to use your cough syrup and drinking lots of fluids.  Dizziness: It sounds like you are having episodes of your BPPV or benign paroxysmal positional vertigo.  There are exercises that can help with symptoms and prevent future dizziness episodes.  There are physical therapist that specializes in this, and I have placed a referral for you to see the specialists to learn these exercises.  If you have a worsening of your dizziness symptoms, if you are having more frequent symptoms, please give [place] a call and we will reconsider your Antivert/meclizine medication refill.  I have ordered the following labs for you:   Lab Orders         Novel Coronavirus, NAA ([facility])       My Chart Access: [email or web address]  Please follow-up in 1 month for a routine health visit.  Please make sure to arrive 15 minutes prior to your next appointment. If you arrive late, you may be asked to reschedule.    We look forward to seeing you next time. Please call our clinic at [phone] if you have any questions or concerns. The best time to call is Monday-Friday from 9am-4pm, but there is someone available 24/7. If after hours or the weekend, call the main hospital number and ask for the Internal Medicine Resident On-Call. If you need medication refills, please notify your pharmacy one week in advance and they will send [place] a request.   Thank you for letting us take part in your care. Wishing you the best!  Wayland Denis, MD 01/19/2021, 9:49 AM IM Resident, PGY-1

## 2021-01-19 NOTE — Assessment & Plan Note (Addendum)
Patient presents 01/19/2021 for OV for cough.  Patient reports 5-day history of cough, fatigue, diffuse muscle aches.  She denies shortness of breath at rest and on exertion.  Her cough is mostly nonproductive.  She has chest pain only with coughing spells.  She denies N/V, diarrhea, constipation.  She denies fevers, chills, nasal congestion, sore throat, postnasal drip.  She has been taking cough syrup for patients with high blood pressure which alleviates symptoms.  Patient is COVID vaccinated, received 3 COVID vaccines.  Received her flu shot this year.  Patient reports she is feeling much better today, about 70 to 75% back to her baseline.  Still has some residual cough.  Is still having fatigue, had to sit down this morning while taking a shower.  On exam, lungs clear to auscultation, afebrile, satting well on room air.  On assessment, patient likely has viral upper respiratory infection.  Discussed with patient that I expect symptoms to continue to resolve.  We will do COVID test today, and patient given instructions for isolation protocol.  Plan: -COVID-19 test today -Given instructions on isolation precautions if test positive -Continue supportive care -Is due for PCP follow-up in 1 month  ADDENDUM: Patient tested COVID-19 positive  1/12 patient called and informed of positive test result.  Encouraged to isolate until Sunday.  Patient endorses resolution of most symptoms other than fatigue.  Discussed with patient that she may experience fatigue up to 4 weeks following COVID-positive diagnosis.  Discussed that symptoms beyond 4 weeks would be considered long COVID.  Patient reminded to follow-up in 1 month for routine follow-up visit and follow-up of her fatigue symptoms.

## 2021-01-19 NOTE — Assessment & Plan Note (Signed)
Patient endorsing 1 week history of episodes of vertigo.  She has 1 episode a day, every morning, when she is lying down in bed and turns too quickly she has dizziness, nausea, mild stomach pain.  These episodes last for 1 to 2 minutes and self resolve with her lying still.  She has asked for refill of meclizine which she has taken for episodes as needed previously with her last series of episodes of BPPV.  Today we discussed seeing a physical therapist to do vestibular therapy, so patient may learn which exercises can help alleviate symptoms and prevent future episodes.  Discussed if patient is having persistent symptoms, worsening of symptoms, greater frequency of symptoms throughout the day, we can reconsider refill of meclizine.  Plan: -Ambulatory referral to physical therapy, comment: Vestibular rehab -Continue to monitor for worsening of episodes

## 2021-01-19 NOTE — Assessment & Plan Note (Signed)
Blood pressure on the low end today 107/67 in the setting of recent URI with poor appetite.  Patient endorsing good p.o. intake of fluids, no lightheadedness or dizziness upon standing.  Morning episodes of dizziness moving in bed while laying down associated with BPPV.  Currently taking amlodipine 10 mg daily and losartan 100 mg daily.  Blood pressures currently well controlled on current regimen, soft blood pressures in the setting of recent URI.  Will need to monitor, if patient is having persistent soft BPs, consider discontinuing one of her antihypertensives.  Plan: -Continue current regimen -Reassess at next visit, if persistent soft BPs will want to consider discontinuing one of her antihypertensives.

## 2021-01-19 NOTE — Progress Notes (Signed)
°  CC: cough  HPI:  [name] is a 81 y.o. female with a past medical history stated below and presents today for cough. Please see problem based assessment and plan for additional details.  Past Medical History:  Diagnosis Date   Anxiety    BPPV (benign paroxysmal positional vertigo)    CAD (coronary artery disease)    4 stents placed prior to CABAG (1996 ? ) , s/p CABG 1996    Diabetes mellitus    Diabetes mellitus, type 2 (HCC) 05/09/2010   Dizziness    Hx of hyperparathyroidism due to renal insufficiency (HCC) 10/17/2014   10/16/2014: PTH 79. Ca, P reassuring.   Hypertension    Hypothyroid    Syncope 07/03/2020    Current Outpatient Medications on File Prior to Visit  Medication Sig Dispense Refill   amLODipine (NORVASC) 10 MG tablet Take 1 tablet (10 mg total) by mouth daily. 90 tablet 3   aspirin EC 81 MG tablet Take 81 mg by mouth in the morning.     dorzolamide-timolol (COSOPT) 22.3-6.8 MG/ML ophthalmic solution 1 drop 2 (two) times daily.     latanoprost (XALATAN) 0.005 % ophthalmic solution 1 drop at bedtime.     levothyroxine (EUTHYROX) 50 MCG tablet Take 1 tablet (50 mcg total) by mouth daily before breakfast. 90 tablet 3   losartan (COZAAR) 100 MG tablet Take 1 tablet (100 mg total) by mouth daily. 90 tablet 3   pravastatin (PRAVACHOL) 40 MG tablet Take 1 tablet (40 mg total) by mouth daily. 90 tablet 3   No current facility-administered medications on file prior to visit.    History reviewed. No pertinent family history.  Social History: Currently lives independently. She has 4 sons-2 in [place]; 1 in [place], and 1 in [place].  16 grands, 5 great grands.  She has been widowed since 28. Does own ADLs and IADLs.  Review of Systems: ROS negative except for what is noted on the assessment and plan.  Vitals:   01/19/21 0855  BP: 107/67  Pulse: 83  Temp: 98.8 F (37.1 C)  TempSrc: Oral  SpO2: 99%  Weight: 172 lb 3.2 oz (78.1 kg)     Physical  Exam: General: Well appearing african [place] female, well groomed, NAD HENT: normocephalic, atraumatic, MMM EYES: conjunctiva non-erythematous, no scleral icterus, EOM intact without nystagmus CV: regular rate, normal rhythm, no murmurs, rubs, gallops. Pulmonary: normal work of breathing on RA, lungs clear to auscultation, specifically no rales, wheezes, or rhonchi Abdominal: non-distended, soft, non-tender to palpation, normal BS Skin: Warm and dry, no rashes or lesions Neurological: MS: awake, alert and oriented x3, normal speech and fund of knowledge Motor: moves all extremities antigravity Psych: normal affect    Assessment & Plan:   See Encounters Tab for problem based charting.  Patient discussed with Dr.  Trinda Pascal, M.D. [facility] Internal Medicine, PGY-1 Pager: [phone] Date 01/19/2021 Time 9:59 AM

## 2021-01-20 LAB — NOVEL CORONAVIRUS, NAA: SARS-CoV-2, NAA: DETECTED — AB

## 2021-01-20 LAB — SARS-COV-2, NAA 2 DAY TAT

## 2021-01-21 NOTE — Progress Notes (Signed)
Internal Medicine Clinic Attending ? ?Case discussed with Dr. Zinoviev  At the time of the visit.  We reviewed the resident?s history and exam and pertinent patient test results.  I agree with the assessment, diagnosis, and plan of care documented in the resident?s note.  ?

## 2021-03-24 ENCOUNTER — Encounter: Payer: Self-pay | Admitting: Internal Medicine

## 2021-03-24 ENCOUNTER — Other Ambulatory Visit: Payer: Self-pay

## 2021-03-24 ENCOUNTER — Ambulatory Visit (INDEPENDENT_AMBULATORY_CARE_PROVIDER_SITE_OTHER): Payer: Medicare Other | Admitting: Internal Medicine

## 2021-03-24 VITALS — BP 139/77 | HR 87 | Temp 98.7°F | Ht 68.0 in | Wt 171.6 lb

## 2021-03-24 DIAGNOSIS — H8110 Benign paroxysmal vertigo, unspecified ear: Secondary | ICD-10-CM | POA: Diagnosis not present

## 2021-03-24 DIAGNOSIS — I5032 Chronic diastolic (congestive) heart failure: Secondary | ICD-10-CM | POA: Diagnosis not present

## 2021-03-24 DIAGNOSIS — E7841 Elevated Lipoprotein(a): Secondary | ICD-10-CM | POA: Diagnosis not present

## 2021-03-24 DIAGNOSIS — N1831 Chronic kidney disease, stage 3a: Secondary | ICD-10-CM | POA: Diagnosis not present

## 2021-03-24 DIAGNOSIS — Z8639 Personal history of other endocrine, nutritional and metabolic disease: Secondary | ICD-10-CM | POA: Diagnosis not present

## 2021-03-24 DIAGNOSIS — Z7189 Other specified counseling: Secondary | ICD-10-CM | POA: Diagnosis not present

## 2021-03-24 DIAGNOSIS — M109 Gout, unspecified: Secondary | ICD-10-CM | POA: Diagnosis not present

## 2021-03-24 DIAGNOSIS — I130 Hypertensive heart and chronic kidney disease with heart failure and stage 1 through stage 4 chronic kidney disease, or unspecified chronic kidney disease: Secondary | ICD-10-CM | POA: Diagnosis not present

## 2021-03-24 DIAGNOSIS — H409 Unspecified glaucoma: Secondary | ICD-10-CM | POA: Diagnosis not present

## 2021-03-24 DIAGNOSIS — E039 Hypothyroidism, unspecified: Secondary | ICD-10-CM | POA: Diagnosis not present

## 2021-03-24 NOTE — Assessment & Plan Note (Signed)
TSH 2.77 in 11/2020.  No symptoms of hyper or hypothyroidism.  Continue current treatment and check annually. ?

## 2021-03-24 NOTE — Assessment & Plan Note (Signed)
Asymptomatic, compensated, on no diuretic.  Monitor. ?

## 2021-03-24 NOTE — Assessment & Plan Note (Signed)
After more consideration of colonoscopy, she has decided to decline, based on her care goals and desire to avoid aggressive intervention even if cancer were to be identified.  Anemia evaluation 11/2020-Fe 55, TIBC 300, ferritin 48, iron saturation 18.  Ferritin noted to be 7 in 06/2020, iron has been repleted. ?

## 2021-03-24 NOTE — Assessment & Plan Note (Signed)
Due for recheck today.  Last EGFR 06/2020 54, CKD 3A. ?

## 2021-03-24 NOTE — Assessment & Plan Note (Addendum)
No gout in over a year, maybe longer.  On no uric acid lowering therapy and none indicated at this time. ?

## 2021-03-24 NOTE — Progress Notes (Addendum)
?  81 year old Jill Golden continues to feel very well, living an active and independent life.  "I do not know why I am here, there is nothing wrong!"  She reports no difficulty with her medicines, no changes in her functional status, no new problems, no discomforts. ? ?Problem list was reviewed.  She has not experienced vertigo in 4 to 5 years and reports that she had been told that she should never drive because of this diagnosis.  We discussed this, and because she has been asymptomatic for so long, there is no reason why she should not be able to drive as long as her vision is adequate with respect to her glaucoma (has a follow-up with Dr. Dione Booze today).  She had since sold her car, but feels that she would be able to obtain another perhaps from a family member.  She was pleased to hear this. ? ?Good conversation today about general perspectives on health care, medical interventions, screening tests, etc.  She is interested in recommendations to maintain her quality of life and independent function, but is very much opposed to significant intervention for serious medical problems, preferring instead to have symptoms managed in the interest of quality rather than quantity of life.   ? ?BP 139/77 (BP Location: Right Arm, Patient Position: Sitting, Cuff Size: Small)   Pulse 87   Temp 98.7 ?F (37.1 ?C) (Oral)   Ht 5\' 8"  (1.727 m)   Wt 171 lb 9.6 oz (77.8 kg)   LMP 05/09/1982   SpO2 100%   BMI 26.09 kg/m?  ?Very healthy and youthful appearing woman who moves with brisk and smooth gait.  Weight is healthy BMI 26. ? ?Assessment and plan (see also problem based documentation): ? ?Jill Golden is doing very very well.  Tolerating medications for HTN, lipids, hypothyroidism, and glaucoma, experiencing no functional impairment.  Due for BMP and lipids. ? ?Recheck 6 months. ?

## 2021-03-24 NOTE — Assessment & Plan Note (Signed)
Long overdue for lipid panel, to be completed today.  She is tolerating the statin well, and continues to have no manifestations of vascular disease. ?

## 2021-03-24 NOTE — Assessment & Plan Note (Signed)
Adherent to eyedrop regimen and follows regularly with her ophthalmologist. ?

## 2021-03-24 NOTE — Assessment & Plan Note (Signed)
BP well controlled, 139/77, improving after some low measurements during a period of viral illness.  No changes in treatment plan. ?

## 2021-03-24 NOTE — Assessment & Plan Note (Signed)
No problems in 4-5 years, will resolve and add to history. ?

## 2021-03-25 LAB — LIPID PANEL
Chol/HDL Ratio: 3.5 ratio (ref 0.0–4.4)
Cholesterol, Total: 170 mg/dL (ref 100–199)
HDL: 48 mg/dL (ref >39)
LDL Chol Calc (NIH): 100 mg/dL — ABNORMAL HIGH (ref 0–99)
Triglycerides: 126 mg/dL (ref 0–149)
VLDL Cholesterol Cal: 22 mg/dL (ref 5–40)

## 2021-03-25 LAB — BMP8+ANION GAP
Anion Gap: 17 mmol/L (ref 10.0–18.0)
BUN/Creatinine Ratio: 13 (ref 12–28)
BUN: 16 mg/dL (ref 8–27)
CO2: 20 mmol/L (ref 20–29)
Calcium: 10.1 mg/dL (ref 8.7–10.3)
Chloride: 102 mmol/L (ref 96–106)
Creatinine, Ser: 1.23 mg/dL — ABNORMAL HIGH (ref 0.57–1.00)
Glucose: 109 mg/dL — ABNORMAL HIGH (ref 70–99)
Potassium: 4.1 mmol/L (ref 3.5–5.2)
Sodium: 139 mmol/L (ref 134–144)
eGFR: 44 mL/min/{1.73_m2} — ABNORMAL LOW (ref >59)

## 2021-09-15 ENCOUNTER — Encounter: Payer: Medicare Other | Admitting: Internal Medicine

## 2021-09-20 ENCOUNTER — Other Ambulatory Visit: Payer: Self-pay | Admitting: *Deleted

## 2021-09-20 DIAGNOSIS — E785 Hyperlipidemia, unspecified: Secondary | ICD-10-CM

## 2021-09-20 DIAGNOSIS — I10 Essential (primary) hypertension: Secondary | ICD-10-CM

## 2021-09-20 DIAGNOSIS — E039 Hypothyroidism, unspecified: Secondary | ICD-10-CM

## 2021-09-20 MED ORDER — LEVOTHYROXINE SODIUM 50 MCG PO TABS: 50.0000 ug | ORAL_TABLET | Freq: Every day | ORAL | 3 refills | Status: DC

## 2021-09-20 MED ORDER — LOSARTAN POTASSIUM 100 MG PO TABS: 100.0000 mg | ORAL_TABLET | Freq: Every day | ORAL | 3 refills | Status: DC

## 2021-09-20 MED ORDER — AMLODIPINE BESYLATE 10 MG PO TABS: 10.0000 mg | ORAL_TABLET | Freq: Every day | ORAL | 3 refills | Status: DC

## 2021-09-20 MED ORDER — PRAVASTATIN SODIUM 40 MG PO TABS: 40.0000 mg | ORAL_TABLET | Freq: Every day | ORAL | 3 refills | Status: DC

## 2021-09-20 NOTE — Telephone Encounter (Signed)
Next appt scheduled 9/21 with PCP. 

## 2021-09-29 ENCOUNTER — Encounter: Payer: Medicare Other | Admitting: Internal Medicine

## 2021-11-28 ENCOUNTER — Telehealth: Payer: Self-pay

## 2021-11-28 NOTE — Telephone Encounter (Addendum)
RTC to patient has had an upset stomach since returning home from out of  town.  States will await appointment with Dr. Mayford Knife.   States will call back if symptoms worsen.

## 2021-11-28 NOTE — Telephone Encounter (Signed)
Requesting to speak with a nurse about feeling dizzy and upset stomach. Please call pt back.

## 2022-02-02 ENCOUNTER — Encounter: Payer: Medicare Other | Admitting: Internal Medicine

## 2022-02-18 ENCOUNTER — Emergency Department (HOSPITAL_COMMUNITY): Payer: Medicare Other

## 2022-02-18 ENCOUNTER — Other Ambulatory Visit: Payer: Self-pay

## 2022-02-18 ENCOUNTER — Emergency Department (HOSPITAL_COMMUNITY)
Admission: EM | Admit: 2022-02-18 | Discharge: 2022-02-18 | Disposition: A | Discharge status: Home / Self Care | Payer: Medicare Other | Attending: Emergency Medicine | Admitting: Emergency Medicine

## 2022-02-18 DIAGNOSIS — R55 Syncope and collapse: Secondary | ICD-10-CM | POA: Diagnosis not present

## 2022-02-18 DIAGNOSIS — Y92512 Supermarket, store or market as the place of occurrence of the external cause: Secondary | ICD-10-CM | POA: Diagnosis not present

## 2022-02-18 DIAGNOSIS — Z7982 Long term (current) use of aspirin: Secondary | ICD-10-CM | POA: Insufficient documentation

## 2022-02-18 DIAGNOSIS — S0003XA Contusion of scalp, initial encounter: Secondary | ICD-10-CM | POA: Diagnosis not present

## 2022-02-18 DIAGNOSIS — I2510 Atherosclerotic heart disease of native coronary artery without angina pectoris: Secondary | ICD-10-CM | POA: Diagnosis not present

## 2022-02-18 DIAGNOSIS — W01198A Fall on same level from slipping, tripping and stumbling with subsequent striking against other object, initial encounter: Secondary | ICD-10-CM | POA: Diagnosis not present

## 2022-02-18 DIAGNOSIS — E041 Nontoxic single thyroid nodule: Secondary | ICD-10-CM | POA: Diagnosis not present

## 2022-02-18 DIAGNOSIS — E119 Type 2 diabetes mellitus without complications: Secondary | ICD-10-CM | POA: Insufficient documentation

## 2022-02-18 DIAGNOSIS — H538 Other visual disturbances: Secondary | ICD-10-CM | POA: Diagnosis not present

## 2022-02-18 DIAGNOSIS — Z79899 Other long term (current) drug therapy: Secondary | ICD-10-CM | POA: Insufficient documentation

## 2022-02-18 DIAGNOSIS — I10 Essential (primary) hypertension: Secondary | ICD-10-CM | POA: Diagnosis not present

## 2022-02-18 DIAGNOSIS — M542 Cervicalgia: Secondary | ICD-10-CM | POA: Diagnosis not present

## 2022-02-18 DIAGNOSIS — R404 Transient alteration of awareness: Secondary | ICD-10-CM | POA: Diagnosis not present

## 2022-02-18 DIAGNOSIS — G4489 Other headache syndrome: Secondary | ICD-10-CM | POA: Diagnosis not present

## 2022-02-18 LAB — BASIC METABOLIC PANEL
Anion gap: 8 (ref 5–15)
BUN: 17 mg/dL (ref 8–23)
CO2: 20 mmol/L — ABNORMAL LOW (ref 22–32)
Calcium: 9.2 mg/dL (ref 8.9–10.3)
Chloride: 109 mmol/L (ref 98–111)
Creatinine, Ser: 1.26 mg/dL — ABNORMAL HIGH (ref 0.44–1.00)
GFR, Estimated: 42 mL/min — ABNORMAL LOW (ref >60)
Glucose, Bld: 115 mg/dL — ABNORMAL HIGH (ref 70–99)
Potassium: 4.1 mmol/L (ref 3.5–5.1)
Sodium: 137 mmol/L (ref 135–145)

## 2022-02-18 LAB — CBC WITH DIFFERENTIAL/PLATELET
Abs Immature Granulocytes: 0.1 10*3/uL — ABNORMAL HIGH (ref 0.00–0.07)
Basophils Absolute: 0 10*3/uL (ref 0.0–0.1)
Basophils Relative: 0 %
Eosinophils Absolute: 0.1 10*3/uL (ref 0.0–0.5)
Eosinophils Relative: 1 %
HCT: 35.2 % — ABNORMAL LOW (ref 36.0–46.0)
Hemoglobin: 11.2 g/dL — ABNORMAL LOW (ref 12.0–15.0)
Immature Granulocytes: 1 %
Lymphocytes Relative: 17 %
Lymphs Abs: 1.8 10*3/uL (ref 0.7–4.0)
MCH: 25.7 pg — ABNORMAL LOW (ref 26.0–34.0)
MCHC: 31.8 g/dL (ref 30.0–36.0)
MCV: 80.7 fL (ref 80.0–100.0)
Monocytes Absolute: 0.6 10*3/uL (ref 0.1–1.0)
Monocytes Relative: 6 %
Neutro Abs: 7.6 10*3/uL (ref 1.7–7.7)
Neutrophils Relative %: 75 %
Platelets: 327 10*3/uL (ref 150–400)
RBC: 4.36 MIL/uL (ref 3.87–5.11)
RDW: 15.7 % — ABNORMAL HIGH (ref 11.5–15.5)
WBC: 10.2 10*3/uL (ref 4.0–10.5)
nRBC: 0 % (ref 0.0–0.2)

## 2022-02-18 LAB — CBG MONITORING, ED: Glucose-Capillary: 104 mg/dL — ABNORMAL HIGH (ref 70–99)

## 2022-02-18 LAB — TROPONIN I (HIGH SENSITIVITY): Troponin I (High Sensitivity): 10 ng/L (ref <18)

## 2022-02-18 MED ORDER — ACETAMINOPHEN 500 MG PO TABS
1000.0000 mg | ORAL_TABLET | Freq: Once | ORAL | Status: AC
  Administered 2022-02-18: 1000 mg via ORAL
  Filled 2022-02-18: qty 2

## 2022-02-18 NOTE — Discharge Instructions (Signed)
Your workup today was reassuring.  You will need to have an ultrasound outpatient follow-up for the thyroid nodule.  Follow-up with your primary this week for reevaluation, this happens you need to return to the ED for additional evaluation and likely admission.

## 2022-02-18 NOTE — ED Triage Notes (Signed)
Pt BIB EMS from [place]. Per EMS, pt had syncopal episode standing in line and hit the back of her head. Pt was unconscious for several minutes even after EMS arrival. When checking orthostatic BP, pt passed out again in sitting position. C-collar in place for precaution. A/Ox3

## 2022-02-18 NOTE — ED Provider Notes (Signed)
[facility] Provider Note   CSN: [id number] Arrival date & time: 02/18/22  1317     History {Add pertinent medical, surgical, social history, OB history to HPI:1} Chief Complaint  Patient presents with   Loss of Consciousness    Jill Golden is a 82 y.o. female.   Loss of Consciousness    This is a 82 year old female with history of hypertension, diabetes, CAD, prior syncopal event presenting to the emergency department due to syncope.  Patient was standing in line at [facility], she was with her son.  Patient states she felt hot, next thing she knew she was on the floor.  Family witnessed the event, states she fell backwards and hit her back of the head against the cement.  There was no prodromal seizure-like activity, patient denies any prodromal chest pain or shortness of breath.  She has pain to the back of her head and neck, no vision changes.  She is not have any difficulty walking, urinary tension, fecal incontinence, lower extremity weakness.  She is not on blood thinners, denies any recent changes in medication.  According to family she does not drink enough water or eat enough food throughout the day.  Last syncopal event was in June 2022, has not happened since then.  Attributed at that time to vasovagal link plus a mix of dehydration.  Home Medications Prior to Admission medications   Medication Sig Start Date End Date Taking? Authorizing Provider  amLODipine (NORVASC) 10 MG tablet Take 1 tablet (10 mg total) by mouth daily. 09/20/21   Angelica Pou, MD  aspirin EC 81 MG tablet Take 81 mg by mouth in the morning.    [provider]  dorzolamide-timolol (COSOPT) 22.3-6.8 MG/ML ophthalmic solution 1 drop 2 (two) times daily. 09/07/20   [provider]  latanoprost (XALATAN) 0.005 % ophthalmic solution 1 drop at bedtime. 09/07/20   [provider]  levothyroxine (EUTHYROX) 50 MCG tablet Take 1 tablet  (50 mcg total) by mouth daily before breakfast. 09/20/21   Angelica Pou, MD  losartan (COZAAR) 100 MG tablet Take 1 tablet (100 mg total) by mouth daily. 09/20/21   Angelica Pou, MD  pravastatin (PRAVACHOL) 40 MG tablet Take 1 tablet (40 mg total) by mouth daily. 09/20/21   Angelica Pou, MD      Allergies    Atenolol, Penicillins, Kiwi extract, and Lisinopril    Review of Systems   Review of Systems  Cardiovascular:  Positive for syncope.    Physical Exam Updated Vital Signs BP 126/74   Pulse 72   Temp 98.5 F (36.9 C) (Oral)   Resp 15   Ht 5' 8"$  (1.727 m)   Wt 81.6 kg   LMP 05/09/1982   SpO2 99%   BMI 27.37 kg/m  Physical Exam Vitals and nursing note reviewed. Exam conducted with a chaperone present.  Constitutional:      Appearance: Normal appearance.  HENT:     Head: Normocephalic.  Eyes:     General: No scleral icterus.       Right eye: No discharge.        Left eye: No discharge.     Extraocular Movements: Extraocular movements intact.     Pupils: Pupils are equal, round, and reactive to light.  Neck:     Comments: Deferred talked or cervical spine imaging, patient currently in c-collar Cardiovascular:     Rate and Rhythm: Normal rate and regular  rhythm.     Pulses: Normal pulses.     Heart sounds: Normal heart sounds.     No friction rub. No gallop.     Comments: Regular rate and rhythm, upper and lower extremity pulses symmetric bilaterally Pulmonary:     Effort: Pulmonary effort is normal. No respiratory distress.     Breath sounds: Normal breath sounds.  Abdominal:     General: Abdomen is flat. Bowel sounds are normal. There is no distension.     Palpations: Abdomen is soft.     Tenderness: There is no abdominal tenderness.  Musculoskeletal:     Comments: Moving upper and lower extremities without any difficulty.  No tenderness over the pelvis or hips bilaterally  Skin:    General: Skin is warm and dry.     Coloration: Skin is not  jaundiced.  Neurological:     Mental Status: She is alert. Mental status is at baseline.     Coordination: Coordination normal.     Comments: Cranial nerves II through XII are grossly intact.  Upper and lower extremity strength is symmetric bilaterally.  Normal finger-nose. Gait deferred for now      ED Results / Procedures / Treatments   Labs (all labs ordered are listed, but only abnormal results are displayed) Labs Reviewed  BASIC METABOLIC PANEL  CBC WITH DIFFERENTIAL/PLATELET  URINALYSIS, ROUTINE W REFLEX MICROSCOPIC  CBG MONITORING, ED  TROPONIN I (HIGH SENSITIVITY)    EKG None  Radiology No results found.  Procedures Procedures  {Document cardiac monitor, telemetry assessment procedure when appropriate:1}  Medications Ordered in ED Medications - No data to display  ED Course/ Medical Decision Making/ A&P Clinical Course as of 02/18/22 1437  Sat Feb 18, 2022  1430 CBC with Differential(!) No leukocytosis, slightly anemic with a hemoglobin 11.2 [HS]  1430 POC CBG, ED(!) [HS]  1430 DG Chest 2 View Mild cardiomegaly but no widened mediastinum, consolidation or fractures [HS]  1431 Last echo 06/2020 EF 60-65% [HS]    Clinical Course User Index [HS] Sherrill Raring, PA-C   {   Click here for ABCD2, HEART and other calculatorsREFRESH Note before signing :1}                          Medical Decision Making Amount and/or Complexity of Data Reviewed Labs: ordered. Decision-making details documented in ED Course. Radiology: ordered. Decision-making details documented in ED Course.   This is an 82 year old female presenting to the emergency department due to syncopal event and head injury.  Differential is broad and includes electrolyte derangement, AKI, arrhythmia, atypical ACS, symptomatic anemia, pneumonia, intracranial trauma, cervical spine injury. Patient's 2 sons are at bedside providing independent history.  Also reviewed external medical records, patient had a  syncopal event in June 2022 with an echocardiogram done during that hospitalization showing EF of 60 to 65%.  On exam patient without any appreciable focal deficit but is.  She is moving extremities without any difficulty.  Do not see any lacerations requiring repair and there is no signs of basilar skull fracture.  Will defer gait and cervical spine until obtaining additional imaging and workup.  Will check basic labs, EKG and will obtain imaging of head and cervical spine.  I ordered and reviewed interpreted laboratory workup as documented ED course.  Discussed HPI, physical exam and plan of care for this patient with attending Octaviano Glow. The attending physician evaluated this patient as part of a shared visit  and agrees with plan of care.   {Document critical care time when appropriate:1} {Document review of labs and clinical decision tools ie heart score, Chads2Vasc2 etc:1}  {Document your independent review of radiology images, and any outside records:1} {Document your discussion with family members, caretakers, and with consultants:1} {Document social determinants of health affecting pt's care:1} {Document your decision making why or why not admission, treatments were needed:1} Final Clinical Impression(s) / ED Diagnoses Final diagnoses:  None    Rx / DC Orders ED Discharge Orders     None

## 2022-02-18 NOTE — ED Notes (Signed)
AVS reviewed with pt and family prior to discharge. Pt verbalizes understanding. Belongings with pt upon depart. Taken to POV by wheelchair with family.

## 2022-02-22 ENCOUNTER — Ambulatory Visit (INDEPENDENT_AMBULATORY_CARE_PROVIDER_SITE_OTHER): Payer: Medicare Other | Admitting: Student

## 2022-02-22 ENCOUNTER — Ambulatory Visit (INDEPENDENT_AMBULATORY_CARE_PROVIDER_SITE_OTHER): Payer: Medicare Other

## 2022-02-22 ENCOUNTER — Other Ambulatory Visit: Payer: Self-pay

## 2022-02-22 ENCOUNTER — Encounter: Payer: Self-pay | Admitting: Student

## 2022-02-22 VITALS — BP 131/77 | HR 78 | Temp 97.8°F | Resp 28 | Ht 68.0 in | Wt 172.5 lb

## 2022-02-22 VITALS — BP 142/82 | HR 86 | Temp 97.8°F | Ht 68.0 in | Wt 172.5 lb

## 2022-02-22 DIAGNOSIS — E039 Hypothyroidism, unspecified: Secondary | ICD-10-CM

## 2022-02-22 DIAGNOSIS — R55 Syncope and collapse: Secondary | ICD-10-CM | POA: Diagnosis not present

## 2022-02-22 DIAGNOSIS — Z0000 Encounter for general adult medical examination without abnormal findings: Secondary | ICD-10-CM

## 2022-02-22 DIAGNOSIS — E041 Nontoxic single thyroid nodule: Secondary | ICD-10-CM

## 2022-02-22 NOTE — Patient Instructions (Signed)
Thank you so much for coming to the clinic today!   Today, we are checking your thyroid level to see if that could be the reason you've been having these episodes. I'm also placing an order for an ultrasound, and they will be in contact with you too.   I've reached out to the cardiologist office to help supply you with the monitor, you should be getting a call from them soon. I'd recommend safely exercising while you're wearing it, just so we can see exactly what your heart does in those situations.   If you have any questions please feel free to the call the clinic at anytime at [phone]. It was a pleasure seeing you!  Best, Dr. Sanjuana Mae

## 2022-02-22 NOTE — Progress Notes (Signed)
Subjective:   Jill Golden is a 82 y.o. female who presents for an Initial Medicare Annual Wellness Visit. I connected with  Jill Golden on 02/22/22 by a  Face-To-Face  enabled telemedicine application and verified that I am speaking with the correct person using two identifiers.  Patient Location: Other:  Office/Clinic  Provider Location: Office/Clinic  I discussed the limitations of evaluation and management by telemedicine. The patient expressed understanding and agreed to proceed.  Review of Systems    Defer to PCP       Objective:    Today's Vitals   02/22/22 1325 02/22/22 1326  BP: (!) 142/82   Pulse: 86   Temp: 97.8 F (36.6 C)   TempSrc: Oral   SpO2: 100%   Weight: 172 lb 8 oz (78.2 kg)   Height: 5' 8"$  (1.727 m)   PainSc:  0-No pain   Body mass index is 26.23 kg/m.     02/22/2022    1:28 PM 02/22/2022    9:13 AM 03/24/2021    8:39 AM 01/19/2021    8:53 AM 11/25/2020   10:28 AM 08/04/2020    1:31 PM 07/19/2020    1:56 PM  Advanced Directives  Does Patient Have a Medical Advance Directive? No No No No No No No  Would patient like information on creating a medical advance directive? No - Patient declined No - Patient declined No - Patient declined No - Patient declined No - Patient declined No - Patient declined No - Patient declined    Current Medications (verified) Outpatient Encounter Medications as of 02/22/2022  Medication Sig   amLODipine (NORVASC) 10 MG tablet Take 1 tablet (10 mg total) by mouth daily.   aspirin EC 81 MG tablet Take 81 mg by mouth in the morning.   dorzolamide-timolol (COSOPT) 22.3-6.8 MG/ML ophthalmic solution 1 drop 2 (two) times daily.   latanoprost (XALATAN) 0.005 % ophthalmic solution 1 drop at bedtime.   levothyroxine (EUTHYROX) 50 MCG tablet Take 1 tablet (50 mcg total) by mouth daily before breakfast.   losartan (COZAAR) 100 MG tablet Take 1 tablet (100 mg total) by mouth daily.   pravastatin (PRAVACHOL) 40 MG tablet  Take 1 tablet (40 mg total) by mouth daily.   No facility-administered encounter medications on file as of 02/22/2022.    Allergies (verified) Atenolol, Penicillins, Kiwi extract, and Lisinopril   History: Past Medical History:  Diagnosis Date   Anxiety    BPPV (benign paroxysmal positional vertigo)    CAD (coronary artery disease)    4 stents placed prior to CABAG (1996 ? ) , s/p CABG 1996    Diabetes mellitus    Diabetes mellitus, type 2 ([place]) 05/09/2010   Dizziness    Hx of hyperparathyroidism due to renal insufficiency ([place]) 10/17/2014   10/16/2014: PTH 79. Ca, P reassuring.   Hypertension    Hypothyroid    Syncope 07/03/2020   Past Surgical History:  Procedure Laterality Date   ABDOMINAL HYSTERECTOMY     APPENDECTOMY     CHOLECYSTECTOMY     CORONARY ARTERY BYPASS GRAFT     TUBAL LIGATION     History reviewed. No pertinent family history. Social History   Socioeconomic History   Marital status: Widowed    Spouse name: Not on file   Number of children: 4   Years of education: Not on file   Highest education level: Not on file  Occupational History   Not on file  Tobacco Use  Smoking status: Former    Types: Cigarettes    Quit date: 05/09/1994    Years since quitting: 27.8   Smokeless tobacco: Never  Substance and Sexual Activity   Alcohol use: No    Alcohol/week: 0.0 standard drinks of alcohol   Drug use: No   Sexual activity: Not on file  Other Topics Concern   Not on file  Social History Narrative   Has 4 sons, 1 is a Physiological scientist and 1 is a mortician-I did not hear the occupations of the other 2.  She has sisters remaining in [place], where she is from.  Enjoys very independent lifestyle and wishes to maintain this independence, including avoiding any dependence on her sons for any reason as she continues to age. (JW 03/24/21)   Social Determinants of Health   Financial Resource Strain: Unknown (02/22/2022)   Overall Financial Resource Strain (CARDIA)     Difficulty of Paying Living Expenses: Patient refused  Food Insecurity: Unknown (02/22/2022)   Hunger Vital Sign    Worried About Running Out of Food in the Last Year: Patient refused    [place] in the Last Year: Patient refused  Transportation Needs: No Transportation Needs (02/22/2022)   PRAPARE - Hydrologist (Medical): No    Lack of Transportation (Non-Medical): No  Physical Activity: Unknown (02/22/2022)   Exercise Vital Sign    Days of Exercise per Week: Patient refused    Minutes of Exercise per Session: Patient refused  Stress: Unknown (02/22/2022)   [place]    Feeling of Stress : Patient refused  Social Connections: Unknown (02/22/2022)   Social Connection and Isolation Panel [NHANES]    Frequency of Communication with Friends and Family: Patient refused    Frequency of Social Gatherings with Friends and Family: Patient refused    Attends Religious Services: Patient refused    Marine scientist or Organizations: Not on file    Attends Archivist Meetings: Patient refused    Marital Status: Patient refused    Tobacco Counseling Counseling given: Not Answered   Clinical Intake:  Pre-visit preparation completed: Yes  Pain : No/denies pain Pain Score: 0-No pain     Nutritional Risks: None Diabetes: Yes CBG done?: No CBG resulted in Enter/ Edit results?: No Did pt. bring in CBG monitor from home?: No  How often do you need to have someone help you when you read instructions, pamphlets, or other written materials from your doctor or pharmacy?: 1 - Never What is the last grade level you completed in school?: 12th grade  Diabetic?No  Interpreter Needed?: No  Information entered by :: Camille Thau,cma 02/12/22 1:26pm   Activities of Daily Living    02/22/2022    1:28 PM 02/22/2022    9:12 AM  In your present state of health, do you have any  difficulty performing the following activities:  Hearing? 0 0  Vision? 0 0  Difficulty concentrating or making decisions? 0 0  Walking or climbing stairs? 0 0  Dressing or bathing? 0 0  Doing errands, shopping? 0 1  Comment  family drives    Patient Care Team: Angelica Pou, MD as PCP - General (Internal Medicine)  Indicate any recent Medical Services you may have received from other than [facility] providers in the past year (date may be approximate).     Assessment:   This is a routine wellness examination for  Jill Golden.  Hearing/Vision screen No results found.  Dietary issues and exercise activities discussed:     Goals Addressed   None   Depression Screen    02/22/2022    1:28 PM 02/22/2022    9:17 AM 03/24/2021    8:39 AM 01/19/2021    8:53 AM 11/25/2020   11:38 AM 07/19/2020    2:01 PM 04/24/2019    1:58 PM  PHQ 2/9 Scores  PHQ - 2 Score 0 0 0 0 0 0 0  PHQ- 9 Score    0 0  0    Fall Risk    02/22/2022    1:28 PM 02/22/2022    9:12 AM 03/24/2021    8:38 AM 01/19/2021    8:52 AM 11/25/2020   10:29 AM  Fall Risk   Falls in the past year? 1 1 0 0 0  Number falls in past yr: 0 0 0 0 0  Injury with Fall? 0 0 0 0 0  Risk for fall due to : Impaired balance/gait Impaired balance/gait;Other (Comment)  No Fall Risks Impaired balance/gait  Risk for fall due to: Comment  passed out     Follow up Falls evaluation completed;Falls prevention discussed Falls evaluation completed;Falls prevention discussed Falls evaluation completed Falls evaluation completed Falls evaluation completed    FALL RISK PREVENTION PERTAINING TO THE HOME:  Any stairs in or around the home? No  If so, are there any without handrails? No  Home free of loose throw rugs in walkways, pet beds, electrical cords, etc? Yes  Adequate lighting in your home to reduce risk of falls? Yes   ASSISTIVE DEVICES UTILIZED TO PREVENT FALLS:  Life alert? No  Use of a cane, walker or w/c? No  Grab bars in the  bathroom? No  Shower chair or bench in shower? No  Elevated toilet seat or a handicapped toilet? No   TIMED UP AND GO:  Was the test performed? No .  Length of time to ambulate 10 feet: 1 min   Gait slow and steady without use of assistive device  Cognitive Function:        02/22/2022    1:28 PM  6CIT Screen  What Year? 0 points  What month? 0 points  What time? 0 points  Count back from 20 0 points  Months in reverse 0 points  Repeat phrase 0 points  Total Score 0 points    Immunizations Immunization History  Administered Date(s) Administered   Influenza Split 01/24/2011   Influenza, High Dose Seasonal PF 11/25/2020   Influenza,inj,Quad PF,6+ Mos 12/02/2012, 10/16/2014, 12/22/2016, 10/14/2018   PFIZER(Purple Top)SARS-COV-2 Vaccination 04/17/2019, 05/12/2019, 12/13/2019   Pneumococcal Conjugate-13 10/16/2014   Pneumococcal Polysaccharide-23 04/22/2010   Tdap 08/01/2010    TDAP status: Due, Education has been provided regarding the importance of this vaccine. Advised may receive this vaccine at local pharmacy or Health Dept. Aware to provide a copy of the vaccination record if obtained from local pharmacy or Health Dept. Verbalized acceptance and understanding.  Flu Vaccine status: Due, Education has been provided regarding the importance of this vaccine. Advised may receive this vaccine at local pharmacy or Health Dept. Aware to provide a copy of the vaccination record if obtained from local pharmacy or Health Dept. Verbalized acceptance and understanding.  Pneumococcal vaccine status: Up to date  Covid-19 vaccine status: Completed vaccines  Qualifies for Shingles Vaccine? No   Zostavax completed No   Shingrix Completed?: No.    Education has been provided  regarding the importance of this vaccine. Patient has been advised to call insurance company to determine out of pocket expense if they have not yet received this vaccine. Advised may also receive vaccine at local  pharmacy or Health Dept. Verbalized acceptance and understanding.  Screening Tests Health Maintenance  Topic Date Due   Zoster Vaccines- Shingrix (1 of 2) Never done   DEXA SCAN  Never done   DTaP/Tdap/Td (2 - Td or Tdap) 07/31/2020   INFLUENZA VACCINE  08/09/2021   COVID-19 Vaccine (4 - 2023-24 season) 09/09/2021   Medicare Annual Wellness (AWV)  02/23/2023   Pneumonia Vaccine 85+ Years old  Completed   HPV VACCINES  Aged Out    Health Maintenance  Health Maintenance Due  Topic Date Due   Zoster Vaccines- Shingrix (1 of 2) Never done   DEXA SCAN  Never done   DTaP/Tdap/Td (2 - Td or Tdap) 07/31/2020   INFLUENZA VACCINE  08/09/2021   COVID-19 Vaccine (4 - 2023-24 season) 09/09/2021       Lung Cancer Screening: (Low Dose CT Chest recommended if Age 33-80 years, 30 pack-year currently smoking OR have quit w/in 15years.) does not qualify.   Lung Cancer Screening Referral: N/A  Additional Screening:  Hepatitis C Screening: does not qualify; Completed N/A  Vision Screening: Recommended annual ophthalmology exams for early detection of glaucoma and other disorders of the eye. Is the patient up to date with their annual eye exam?  Yes  Who is the provider or what is the name of the office in which the patient attends annual eye exams? Dr.Groat If pt is not established with a provider, would they like to be referred to a provider to establish care? No .   Dental Screening: Recommended annual dental exams for proper oral hygiene  Community Resource Referral / Chronic Care Management: CRR required this visit?  No   CCM required this visit?  No      Plan:     I have personally reviewed and noted the following in the patient's chart:   Medical and social history Use of alcohol, tobacco or illicit drugs  Current medications and supplements including opioid prescriptions. Patient is not currently taking opioid prescriptions. Functional ability and status Nutritional  status Physical activity Advanced directives List of other physicians Hospitalizations, surgeries, and ER visits in previous 12 months Vitals Screenings to include cognitive, depression, and falls Referrals and appointments  In addition, I have reviewed and discussed with patient certain preventive protocols, quality metrics, and best practice recommendations. A written personalized care plan for preventive services as well as general preventive health recommendations were provided to patient.     Kerin Perna, [facility]   02/22/2022   Nurse Notes: Face-To-Face Visit  Ms. Jill Golden , Thank you for taking time to come for your Medicare Wellness Visit. I appreciate your ongoing commitment to your health goals. Please review the following plan we discussed and let me know if I can assist you in the future.   These are the goals we discussed:  Goals      Blood Pressure < 130/80     HEMOGLOBIN A1C < 7.0     LDL CALC < 70        This is a list of the screening recommended for you and due dates:  Health Maintenance  Topic Date Due   Zoster (Shingles) Vaccine (1 of 2) Never done   DEXA scan (bone density measurement)  Never done   DTaP/Tdap/Td vaccine (2 -  Td or Tdap) 07/31/2020   Flu Shot  08/09/2021   COVID-19 Vaccine (4 - 2023-24 season) 09/09/2021   Medicare Annual Wellness Visit  02/23/2023   Pneumonia Vaccine  Completed   HPV Vaccine  Aged Out

## 2022-02-24 DIAGNOSIS — E041 Nontoxic single thyroid nodule: Secondary | ICD-10-CM | POA: Insufficient documentation

## 2022-02-24 LAB — TSH: TSH: 5.68 u[IU]/mL — ABNORMAL HIGH (ref 0.450–4.500)

## 2022-02-24 NOTE — Assessment & Plan Note (Addendum)
Patient presents with a third syncopal episode in the last 2 years.  History was taken from patient, and also her son 1 on the phone and 1 present in the room.  She was at [facility] for about 30 to 45 minutes and when she got to the checkout line she felt dizzy and then collapsed.  Son was able to provide a little bit more history, as he was standing right in front of breath and out of the corner of his eye he saw her go to the ground.  She was unconscious for maybe 3 to 4 minutes, and when EMS arrived she was arousable.  This happened on February 10, and she went to the emergency department and lab testing was negative, chest x-ray negative EKG showed normal sinus rhythm, and CT head and cervical spine was without any acute abnormality.  This was thought to be vasovagal reaction.  She is able to take her medications correctly and on time, she lives with one of her sons and he also helps with her medications.  She takes amlodipine, levothyroxine, and losartan.  Her last syncopal episode which she was hospitalized for was secondary to what seems like heat exhaustion.  It seems like there is a relationship between these episodes and strenuous activity and prolonged immobilization.  Of note, patient also has a thyroid nodule present.  Given repeated syncopal episodes, without any objective findings explaining the syncope, we will place patient on a Holter monitor for 72 hours.  Encourage patient to safely attempt to ambulate conditions in which she syncopized.  Given patient's age and repeated syncopal episodes, she could have sick sinus syndrome.  Will also check a TSH given thyroid nodule and obtain thyroid ultrasound.  Plan: - Ordered TSH, and thyroid ultrasound - Reached out to cardiology to provide patient with Holter monitor -Encourage patient to remain hydrated

## 2022-02-24 NOTE — Progress Notes (Signed)
CC: Syncopal episode  HPI:  [name] is a 82 y.o. female living with a history stated below and presents today for syncopal episode. Please see problem based assessment and plan for additional details.  Past Medical History:  Diagnosis Date   Anxiety    BPPV (benign paroxysmal positional vertigo)    CAD (coronary artery disease)    4 stents placed prior to CABAG (1996 ? ) , s/p CABG 1996    Diabetes mellitus    Diabetes mellitus, type 2 ([place]) 05/09/2010   Dizziness    Hx of hyperparathyroidism due to renal insufficiency ([place]) 10/17/2014   10/16/2014: PTH 79. Ca, P reassuring.   Hypertension    Hypothyroid    Syncope 07/03/2020    Current Outpatient Medications on File Prior to Visit  Medication Sig Dispense Refill   amLODipine (NORVASC) 10 MG tablet Take 1 tablet (10 mg total) by mouth daily. 90 tablet 3   aspirin EC 81 MG tablet Take 81 mg by mouth in the morning.     dorzolamide-timolol (COSOPT) 22.3-6.8 MG/ML ophthalmic solution 1 drop 2 (two) times daily.     latanoprost (XALATAN) 0.005 % ophthalmic solution 1 drop at bedtime.     levothyroxine (EUTHYROX) 50 MCG tablet Take 1 tablet (50 mcg total) by mouth daily before breakfast. 90 tablet 3   losartan (COZAAR) 100 MG tablet Take 1 tablet (100 mg total) by mouth daily. 90 tablet 3   pravastatin (PRAVACHOL) 40 MG tablet Take 1 tablet (40 mg total) by mouth daily. 90 tablet 3   No current facility-administered medications on file prior to visit.    No family history on file.  Social History   Socioeconomic History   Marital status: Widowed    Spouse name: Not on file   Number of children: 4   Years of education: Not on file   Highest education level: Not on file  Occupational History   Not on file  Tobacco Use   Smoking status: Former    Types: Cigarettes    Quit date: 05/09/1994    Years since quitting: 27.8   Smokeless tobacco: Never  Substance and Sexual Activity   Alcohol use: No     Alcohol/week: 0.0 standard drinks of alcohol   Drug use: No   Sexual activity: Not on file  Other Topics Concern   Not on file  Social History Narrative   Has 4 sons, 1 is a Physiological scientist and 1 is a mortician-I did not hear the occupations of the other 2.  She has sisters remaining in [place], where she is from.  Enjoys very independent lifestyle and wishes to maintain this independence, including avoiding any dependence on her sons for any reason as she continues to age. (JW 03/24/21)   Social Determinants of Health   Financial Resource Strain: Unknown (02/22/2022)   Overall Financial Resource Strain (CARDIA)    Difficulty of Paying Living Expenses: Patient refused  Food Insecurity: Unknown (02/22/2022)   Hunger Vital Sign    Worried About Running Out of Food in the Last Year: Patient refused    [place] in the Last Year: Patient refused  Transportation Needs: No Transportation Needs (02/22/2022)   PRAPARE - Hydrologist (Medical): No    Lack of Transportation (Non-Medical): No  Physical Activity: Unknown (02/22/2022)   Exercise Vital Sign    Days of Exercise per Week: Patient refused    Minutes of Exercise per Session:  Patient refused  Stress: Unknown (02/22/2022)   [place]    Feeling of Stress : Patient refused  Social Connections: Unknown (02/22/2022)   Social Connection and Isolation Panel [NHANES]    Frequency of Communication with Friends and Family: Patient refused    Frequency of Social Gatherings with Friends and Family: Patient refused    Attends Religious Services: Patient refused    Active Member of Clubs or Organizations: Not on file    Attends Archivist Meetings: Patient refused    Marital Status: Patient refused  Intimate Partner Violence: Not At Risk (02/22/2022)   Humiliation, Afraid, Rape, and Kick questionnaire    Fear of Current or Ex-Partner: No     Emotionally Abused: No    Physically Abused: No    Sexually Abused: No    Review of Systems: ROS negative except for what is noted on the assessment and plan.  Vitals:   02/22/22 0913 02/22/22 0918  BP: (!) 142/82 131/77  Pulse: 86 78  Resp: (!) 28   Temp: 97.8 F (36.6 C)   TempSrc: Oral   SpO2: 100%   Weight: 172 lb 8 oz (78.2 kg)   Height: 5' 8"$  (1.727 m)     Physical Exam: Constitutional: well-appearing woman, sitting in chair, in no acute distress HENT: normocephalic atraumatic, mucous membranes moist, Dix-Hallpike maneuver negative Eyes: conjunctiva non-erythematous Neck: supple,  Cardiovascular: regular rate and rhythm, no m/r/g Pulmonary/Chest: normal work of breathing on room air, lungs clear to auscultation bilaterally Abdominal: soft, non-tender, non-distended MSK: normal bulk and tone Neurological: alert & oriented x 3, 5/5 strength in bilateral upper and lower extremities, normal gait, normal sensation in all extremities, cranial nerves II through XI intact Skin: warm and dry   Assessment & Plan:   Syncope Patient presents with a third syncopal episode in the last 2 years.  History was taken from patient, and also her son 1 on the phone and 1 present in the room.  She was at [facility] for about 30 to 45 minutes and when she got to the checkout line she felt dizzy and then collapsed.  Son was able to provide a little bit more history, as he was standing right in front of breath and out of the corner of his eye he saw her go to the ground.  She was unconscious for maybe 3 to 4 minutes, and when EMS arrived she was arousable.  This happened on February 10, and she went to the emergency department and lab testing was negative, chest x-ray negative EKG showed normal sinus rhythm, and CT head and cervical spine was without any acute abnormality.  This was thought to be vasovagal reaction.  She is able to take her medications correctly and on time, she lives with one of her  sons and he also helps with her medications.  She takes amlodipine, levothyroxine, and losartan.  Her last syncopal episode which she was hospitalized for was secondary to what seems like heat exhaustion.  It seems like there is a relationship between these episodes and strenuous activity and prolonged immobilization.  Of note, patient also has a thyroid nodule present.  Given repeated syncopal episodes, without any objective findings explaining the syncope, we will place patient on a Holter monitor for 72 hours.  Encourage patient to safely attempt to ambulate conditions in which she syncopized.  Given patient's age and repeated syncopal episodes, she could have sick sinus syndrome.  Will also  check a TSH given thyroid nodule and obtain thyroid ultrasound.  Plan: - Ordered TSH, and thyroid ultrasound - Reached out to cardiology to provide patient with Holter monitor -Encourage patient to remain hydrated  Thyroid nodule Multiple heterogeneous thyroid nodules measuring up to 2 cm found on CT scan on February 10 after patient had syncopal episode.  This could be an explanation for her most recent syncopal episode, however unable to palpate thyroid nodules.  She denies any symptoms of hyperthyroidism such as palpitations, weight loss.  Plan: - Check TSH, and thyroid ultrasound  Patient discussed with Dr. Ronny Bacon Jenisha Faison, M.D. [facility], PGY-1 Phone: [phone] Date 02/24/2022 Time 7:56 AM

## 2022-02-24 NOTE — Assessment & Plan Note (Signed)
Multiple heterogeneous thyroid nodules measuring up to 2 cm found on CT scan on February 10 after patient had syncopal episode.  This could be an explanation for her most recent syncopal episode, however unable to palpate thyroid nodules.  She denies any symptoms of hyperthyroidism such as palpitations, weight loss.  Plan: - Check TSH, and thyroid ultrasound

## 2022-02-27 NOTE — Progress Notes (Signed)
Internal Medicine Clinic Attending  Case discussed with Dr. Sanjuana Mae  at the time of the visit.  We reviewed the resident's history and exam and pertinent patient test results.  I agree with the assessment, diagnosis, and plan of care documented in the resident's note.

## 2022-03-01 NOTE — Progress Notes (Signed)
Internal Medicine Clinic Attending  Case and documentation of Dr. Sanjuana Mae  soon after the resident saw the patient reviewed.  I reviewed the AWV findings.  I agree with the assessment, diagnosis, and plan of care documented in the AWV note.

## 2022-03-14 ENCOUNTER — Other Ambulatory Visit (HOSPITAL_COMMUNITY): Payer: Medicare Other

## 2022-03-20 ENCOUNTER — Ambulatory Visit (HOSPITAL_COMMUNITY)
Admission: RE | Admit: 2022-03-20 | Discharge: 2022-03-20 | Disposition: A | Discharge status: Home / Self Care | Payer: Medicare Other | Source: Ambulatory Visit | Attending: Internal Medicine | Admitting: Internal Medicine

## 2022-03-20 DIAGNOSIS — E039 Hypothyroidism, unspecified: Secondary | ICD-10-CM | POA: Diagnosis not present

## 2022-03-20 DIAGNOSIS — E041 Nontoxic single thyroid nodule: Secondary | ICD-10-CM | POA: Diagnosis not present

## 2022-03-23 ENCOUNTER — Encounter: Payer: Medicare Other | Admitting: Internal Medicine

## 2022-03-28 ENCOUNTER — Encounter: Payer: Medicare Other | Admitting: Internal Medicine

## 2022-04-13 NOTE — Addendum Note (Signed)
Addended by: Truddie Crumble on: 04/13/2022 10:28 AM   Modules accepted: Orders

## 2022-05-11 ENCOUNTER — Encounter: Payer: Medicare Other | Admitting: Internal Medicine

## 2022-06-15 ENCOUNTER — Ambulatory Visit (INDEPENDENT_AMBULATORY_CARE_PROVIDER_SITE_OTHER): Payer: Medicare Other | Admitting: Internal Medicine

## 2022-06-15 VITALS — BP 138/82 | HR 69 | Temp 98.2°F | Ht 68.0 in | Wt 171.7 lb

## 2022-06-15 DIAGNOSIS — Z8679 Personal history of other diseases of the circulatory system: Secondary | ICD-10-CM | POA: Diagnosis not present

## 2022-06-15 DIAGNOSIS — N1831 Chronic kidney disease, stage 3a: Secondary | ICD-10-CM

## 2022-06-15 DIAGNOSIS — Z8639 Personal history of other endocrine, nutritional and metabolic disease: Secondary | ICD-10-CM | POA: Diagnosis not present

## 2022-06-15 DIAGNOSIS — Z7189 Other specified counseling: Secondary | ICD-10-CM

## 2022-06-15 DIAGNOSIS — E041 Nontoxic single thyroid nodule: Secondary | ICD-10-CM

## 2022-06-15 DIAGNOSIS — I10 Essential (primary) hypertension: Secondary | ICD-10-CM

## 2022-06-15 DIAGNOSIS — I129 Hypertensive chronic kidney disease with stage 1 through stage 4 chronic kidney disease, or unspecified chronic kidney disease: Secondary | ICD-10-CM

## 2022-06-15 DIAGNOSIS — E7841 Elevated Lipoprotein(a): Secondary | ICD-10-CM

## 2022-06-15 NOTE — Assessment & Plan Note (Signed)
LDL 100 1 yr ago on pravastatin 40 mg. While goal in secondary prevention is < 70, she wishes to make no changes at this time.

## 2022-06-15 NOTE — Progress Notes (Signed)
Routine annual check in with well-functioning, active, independent Jill Golden who has no medical concerns today, completely asymptomatic despite exhaustive ROS.  Two of her brothers recently passed away; she is handling it well and is at peace, experiencing normal grief. We discussed the fainting spells she had in the interim, for which she sought care; no etiology was identified.  She had felt completely normal both before and after the spells. BP 138/82 (BP Location: Right Arm, Patient Position: Sitting, Cuff Size: Small)   Pulse 69   Temp 98.2 F (36.8 C) (Oral)   Ht 5\' 8"  (1.727 m)   Wt 171 lb 11.2 oz (77.9 kg)   LMP 05/09/1982   SpO2 100%   BMI 26.11 kg/m  Lithe youthful appearing smartly dressed woman with normal habitus, smooth and balanced gait,  fun to visit with, animated in conversation.  Neck veins flat. Regular rhythm; radial,dp and pt pulses 2+.  Feet/nails/skin in excellent condition.  No LE edema, no loss of muscle bulk.  Assessment and plan:  Jill Golden is doing very well and most likely has many years of quality life remaining.  We had a good discussion today reiterating her philosophy about medical care and her desire to avoid interventions and unnecessary medical tests and medications.  She has prepared some advanced directives (including DNR), though I have not seen them.  She will return in 6 to 12 months for recheck of the following problems:  Hx of coronary artery disease s/p CABG in 1996 No chest pain/tightness/dyspnea with exertion or at rest.  No claudication.  Goal LDL < 70 for secondary prevention of CVD; she is at around 100 and doesn't wish to make changes. BP is well controlled, takes ASA.    Hypertension Well controlled 138/82 on amlodipine 10 mg and losartan 100 mg daily.  Continue.  Stage 3a chronic kidney disease (CKD) (HCC) eGFR stable over past year; remains stage 3a. BMP next visit. She is on stable regimen including ARB.   History of iron  deficiency Hg stable on last check 02/2022; no apparent ongoing problem.  Monitor annually and prn. She reiterates desire to avoid colonoscopy.  Hyperlipidemia LDL 100 1 yr ago on pravastatin 40 mg. While goal in secondary prevention is < 70, she wishes to make no changes at this time.   Thyroid nodule, no bx needed per [place] 03/2022 [place] 03/2022:  1. 1.2 cm right mid thyroid TR 3 nodule does not meet criteria for biopsy or follow-up. 2. No other significant finding by ultrasound.

## 2022-06-15 NOTE — Assessment & Plan Note (Signed)
[place] 03/2022:  1. 1.2 cm right mid thyroid TR 3 nodule does not meet criteria for biopsy or follow-up. 2. No other significant finding by ultrasound.

## 2022-06-15 NOTE — Assessment & Plan Note (Signed)
Well controlled 138/82 on amlodipine 10 mg and losartan 100 mg daily.  Continue.

## 2022-06-15 NOTE — Assessment & Plan Note (Signed)
Reiterated her goals as discussed a year ago.  She is comfortable with ongoing declining of screening tests.  "I'm happy, I feel well, I don't want any surgeries or fancy treatments.  When it's time for me to get sick and die, it's time."

## 2022-06-15 NOTE — Assessment & Plan Note (Signed)
Hg stable on last check 02/2022; no apparent ongoing problem.  Monitor annually and prn. She reiterates desire to avoid colonoscopy.

## 2022-06-15 NOTE — Assessment & Plan Note (Signed)
No chest pain/tightness/dyspnea with exertion or at rest.  No claudication.  Goal LDL < 70 for secondary prevention of CVD; she is at around 100 and doesn't wish to make changes. BP is well controlled, takes ASA.

## 2022-06-15 NOTE — Assessment & Plan Note (Signed)
eGFR stable over past year; remains stage 3a. BMP next visit. She is on stable regimen including ARB.

## 2022-06-15 NOTE — Patient Instructions (Signed)
Ms. Senick,  It was great to see you again!  I enjoy our conversations - you have such wisdom.  We can get together again in 6-12 months, I'll leave it up to you - of course if something new comes along, we are here.  Today we are making no changes in your medicines.  Your blood pressure looks good, actually the WHOLE YOU looks great!    Take care and stay well,  Dr. Mayford Knife

## 2022-07-17 ENCOUNTER — Telehealth: Payer: Self-pay

## 2022-07-17 NOTE — Telephone Encounter (Signed)
ERROR

## 2022-07-18 ENCOUNTER — Encounter: Payer: Medicare Other | Admitting: Internal Medicine

## 2022-08-29 ENCOUNTER — Encounter: Payer: Medicare Other | Admitting: Internal Medicine

## 2022-09-25 ENCOUNTER — Other Ambulatory Visit: Payer: Self-pay

## 2022-09-25 DIAGNOSIS — E039 Hypothyroidism, unspecified: Secondary | ICD-10-CM

## 2022-09-25 DIAGNOSIS — I10 Essential (primary) hypertension: Secondary | ICD-10-CM

## 2022-09-25 DIAGNOSIS — E785 Hyperlipidemia, unspecified: Secondary | ICD-10-CM

## 2022-09-25 MED ORDER — AMLODIPINE BESYLATE 10 MG PO TABS: 10.0000 mg | ORAL_TABLET | Freq: Every day | ORAL | 3 refills | Status: DC

## 2022-09-25 MED ORDER — LOSARTAN POTASSIUM 100 MG PO TABS: 100.0000 mg | ORAL_TABLET | Freq: Every day | ORAL | 3 refills | Status: DC

## 2022-09-25 MED ORDER — LEVOTHYROXINE SODIUM 50 MCG PO TABS: 50.0000 ug | ORAL_TABLET | Freq: Every day | ORAL | 3 refills | Status: DC

## 2022-09-25 MED ORDER — PRAVASTATIN SODIUM 40 MG PO TABS: 40.0000 mg | ORAL_TABLET | Freq: Every day | ORAL | 3 refills | Status: DC

## 2022-10-30 DIAGNOSIS — H401133 Primary open-angle glaucoma, bilateral, severe stage: Secondary | ICD-10-CM | POA: Diagnosis not present

## 2022-11-23 ENCOUNTER — Encounter: Payer: Medicare Other | Admitting: Internal Medicine

## 2023-01-17 DIAGNOSIS — H401133 Primary open-angle glaucoma, bilateral, severe stage: Secondary | ICD-10-CM | POA: Diagnosis not present

## 2023-03-19 ENCOUNTER — Telehealth: Payer: Self-pay

## 2023-03-19 NOTE — Telephone Encounter (Signed)
 Pharmacy requesting clarification to change the manufacturer for levothyroxine. Manufacturer is not listed.

## 2023-03-29 ENCOUNTER — Encounter: Payer: Medicare Other | Admitting: Internal Medicine

## 2023-04-26 ENCOUNTER — Encounter: Admitting: Internal Medicine

## 2023-05-01 ENCOUNTER — Encounter: Payer: Self-pay | Admitting: Internal Medicine

## 2023-05-01 ENCOUNTER — Ambulatory Visit: Admitting: Internal Medicine

## 2023-05-01 VITALS — BP 123/65 | HR 63 | Temp 97.9°F | Ht 68.0 in | Wt 170.0 lb

## 2023-05-01 DIAGNOSIS — I5032 Chronic diastolic (congestive) heart failure: Secondary | ICD-10-CM

## 2023-05-01 DIAGNOSIS — E039 Hypothyroidism, unspecified: Secondary | ICD-10-CM | POA: Diagnosis not present

## 2023-05-01 DIAGNOSIS — I10 Essential (primary) hypertension: Secondary | ICD-10-CM

## 2023-05-01 DIAGNOSIS — E785 Hyperlipidemia, unspecified: Secondary | ICD-10-CM | POA: Diagnosis not present

## 2023-05-01 DIAGNOSIS — Z8679 Personal history of other diseases of the circulatory system: Secondary | ICD-10-CM | POA: Diagnosis not present

## 2023-05-01 DIAGNOSIS — I130 Hypertensive heart and chronic kidney disease with heart failure and stage 1 through stage 4 chronic kidney disease, or unspecified chronic kidney disease: Secondary | ICD-10-CM | POA: Diagnosis not present

## 2023-05-01 DIAGNOSIS — N1831 Chronic kidney disease, stage 3a: Secondary | ICD-10-CM

## 2023-05-01 NOTE — Assessment & Plan Note (Signed)
 No symptoms of hyper or hypothyroidism. Continue current treatment and check today and annually

## 2023-05-01 NOTE — Assessment & Plan Note (Signed)
 NO acute gout flares in a couple of years.  On no urate lowering therapy, none indicated at this time.  Monitor.

## 2023-05-01 NOTE — Progress Notes (Signed)
   83 y.o. Jill Golden is here for routine 6-12 m follow-up of hx of CAD/CABG, mild chronic HFrEF, HTN, stage 3a CKD, hypothyroidism.  She is a very independently functioning woman whose chronic conditions in no way have limited her activity and quality of life.    Since last visit patient has seen her ophthalmologist for management of glaucoma.   No concerns, mentions mild paraverteral muscle fatigue and aching on occasion, which resolved with sitting down to rest; her son is a exercise trainer who ensures she exercises (she finds this entertaining if not annoying). Has a granddaughter who is 7 who keeps her very busy. Passing time by going to the mall, walking, sitting when tired, watching people. Lives life with humor.   Patient Active Problem List   Diagnosis Date Noted   Hx of coronary artery disease s/p CABG in 1996 06/15/2022   Thyroid  nodule, no bx needed per US  03/2022 02/24/2022   Goals of care, counseling/discussion 03/24/2021   History of iron deficiency 07/19/2020   Chronic heart failure with preserved ejection fraction (mild diastolic dysfxn, asymptomatic (HCC) 10/17/2014   Hx of hyperparathyroidism due to renal insufficiency (HCC) 10/17/2014   Glaucoma 05/19/2013   Stage 3a chronic kidney disease (CKD) (HCC) 12/02/2012   Gout 04/25/2012   Hypothyroidism 04/26/2011   Hyperlipidemia 01/24/2011   Hypertension 05/09/2010   S/P CABG (coronary artery bypass graft) 05/09/2010    Current Outpatient Medications:    amLODipine  (NORVASC ) 10 MG tablet, Take 1 tablet (10 mg total) by mouth daily., Disp: 90 tablet, Rfl: 3   aspirin  EC 81 MG tablet, Take 81 mg by mouth in the morning., Disp: , Rfl:    dorzolamide-timolol (COSOPT) 22.3-6.8 MG/ML ophthalmic solution, 1 drop 2 (two) times daily., Disp: , Rfl:    latanoprost (XALATAN) 0.005 % ophthalmic solution, 1 drop at bedtime., Disp: , Rfl:    levothyroxine  (EUTHYROX ) 50 MCG tablet, Take 1 tablet (50 mcg total) by mouth daily  before breakfast., Disp: 90 tablet, Rfl: 3   losartan  (COZAAR ) 100 MG tablet, Take 1 tablet (100 mg total) by mouth daily., Disp: 90 tablet, Rfl: 3   pravastatin  (PRAVACHOL ) 40 MG tablet, Take 1 tablet (40 mg total) by mouth daily., Disp: 90 tablet, Rfl: 3  Functional Status: Lives alone where she is successfully independent in all ADLs and IADLs. Excellent family support.   Objective  Exam: Lithe youthful appearing smartly dressed woman with normal habitus, smooth and balanced gait, fun to visit with, animated in conversation. Neck veins flat. Regular rhythm; radial,dp and pt pulses 2+. Feet/nails/skin in excellent condition. No LE edema, no loss of muscle bulk.  Excellent ROM at [facility]. Skin well hydrated.    Assessment and Plan: There are no diagnoses linked to this encounter.   No follow-ups on file.

## 2023-05-01 NOTE — Assessment & Plan Note (Signed)
Asymptomatic, compensated, on no diuretic.  Monitor. ?

## 2023-05-01 NOTE — Assessment & Plan Note (Signed)
 Discussed ideally obtaining updated lipid panel to ensure she is being optimally treated.  She declines.

## 2023-05-01 NOTE — Assessment & Plan Note (Signed)
 Well controlled at 123/65 on amlodipine  10 mg daily and losartan  100 mg daily.  Check bmp today for renal fxn.

## 2023-05-02 LAB — LIPID PANEL
Chol/HDL Ratio: 3.8 ratio (ref 0.0–4.4)
Cholesterol, Total: 150 mg/dL (ref 100–199)
HDL: 39 mg/dL — ABNORMAL LOW (ref >39)
LDL Chol Calc (NIH): 87 mg/dL (ref 0–99)
Triglycerides: 134 mg/dL (ref 0–149)
VLDL Cholesterol Cal: 24 mg/dL (ref 5–40)

## 2023-05-02 LAB — BMP8+ANION GAP
Anion Gap: 13 mmol/L (ref 10.0–18.0)
BUN/Creatinine Ratio: 13 (ref 12–28)
BUN: 14 mg/dL (ref 8–27)
CO2: 21 mmol/L (ref 20–29)
Calcium: 9.5 mg/dL (ref 8.7–10.3)
Chloride: 106 mmol/L (ref 96–106)
Creatinine, Ser: 1.04 mg/dL — ABNORMAL HIGH (ref 0.57–1.00)
Glucose: 100 mg/dL — ABNORMAL HIGH (ref 70–99)
Potassium: 4.3 mmol/L (ref 3.5–5.2)
Sodium: 140 mmol/L (ref 134–144)
eGFR: 53 mL/min/{1.73_m2} — ABNORMAL LOW (ref >59)

## 2023-05-02 LAB — TSH: TSH: 3.47 u[IU]/mL (ref 0.450–4.500)

## 2023-05-07 NOTE — Progress Notes (Signed)
 Called to convey normal lab results. No changes needed.

## 2023-05-21 DIAGNOSIS — H401133 Primary open-angle glaucoma, bilateral, severe stage: Secondary | ICD-10-CM | POA: Diagnosis not present

## 2023-05-23 ENCOUNTER — Ambulatory Visit

## 2023-05-23 VITALS — Wt 170.0 lb

## 2023-05-23 DIAGNOSIS — Z0000 Encounter for general adult medical examination without abnormal findings: Secondary | ICD-10-CM | POA: Diagnosis not present

## 2023-05-23 NOTE — Progress Notes (Addendum)
 Because this visit was a virtual/telehealth visit,  certain criteria was not obtained, such a blood pressure, CBG if applicable, and timed get up and go. Any medications not marked as "taking" were not mentioned during the medication reconciliation part of the visit. Any vitals not documented were not able to be obtained due to this being a telehealth visit or patient was unable to self-report a recent blood pressure reading due to a lack of equipment at home via telehealth. Vitals that have been documented are verbally provided by the patient.   Subjective:   Jill Golden is a 83 y.o. who presents for a Medicare Wellness preventive visit.  As a reminder, Annual Wellness Visits don't include a physical exam, and some assessments may be limited, especially if this visit is performed virtually. We may recommend an in-person visit if needed.  Visit Complete: Virtual I connected with  Jill Golden on 05/23/23 by a audio enabled telemedicine application and verified that I am speaking with the correct person using two identifiers.  Patient Location: Home  Provider Location: Office/Clinic  I discussed the limitations of evaluation and management by telemedicine. The patient expressed understanding and agreed to proceed.  Vital Signs: Because this visit was a virtual/telehealth visit, some criteria may be missing or patient reported. Any vitals not documented were not able to be obtained and vitals that have been documented are patient reported.  VideoDeclined- This patient declined Librarian, academic. Therefore the visit was completed with audio only.  Persons Participating in Visit: Patient.  AWV Questionnaire: No: Patient Medicare AWV questionnaire was not completed prior to this visit.  Cardiac Risk Factors include: advanced age (>38men, >33 women);diabetes mellitus;hypertension;sedentary lifestyle     Objective:     Today's Vitals   05/23/23 0943   Weight: 170 lb (77.1 kg)  PainSc: 0-No pain   Body mass index is 25.85 kg/m.     05/23/2023    9:45 AM 06/15/2022    9:50 AM 02/22/2022    1:28 PM 02/22/2022    9:13 AM 03/24/2021    8:39 AM 01/19/2021    8:53 AM 11/25/2020   10:28 AM  Advanced Directives  Does Patient Have a Medical Advance Directive? No No No No No No No  Would patient like information on creating a medical advance directive? Yes (MAU/Ambulatory/Procedural Areas - Information given) No - Patient declined No - Patient declined No - Patient declined No - Patient declined No - Patient declined No - Patient declined    Current Medications (verified) Outpatient Encounter Medications as of 05/23/2023  Medication Sig   amLODipine  (NORVASC ) 10 MG tablet Take 1 tablet (10 mg total) by mouth daily.   aspirin  EC 81 MG tablet Take 81 mg by mouth in the morning.   dorzolamide-timolol (COSOPT) 22.3-6.8 MG/ML ophthalmic solution 1 drop 2 (two) times daily.   latanoprost (XALATAN) 0.005 % ophthalmic solution 1 drop at bedtime.   levothyroxine  (EUTHYROX ) 50 MCG tablet Take 1 tablet (50 mcg total) by mouth daily before breakfast.   losartan  (COZAAR ) 100 MG tablet Take 1 tablet (100 mg total) by mouth daily.   pravastatin  (PRAVACHOL ) 40 MG tablet Take 1 tablet (40 mg total) by mouth daily.   No facility-administered encounter medications on file as of 05/23/2023.    Allergies (verified) Atenolol , Penicillins, Kiwi extract, and Lisinopril    History: Past Medical History:  Diagnosis Date   Anxiety    BPPV (benign paroxysmal positional vertigo)    CAD (coronary  artery disease)    4 stents placed prior to CABAG (1996 ? ) , s/p CABG 1996    Diabetes mellitus    Diabetes mellitus, type 2 (HCC) 05/09/2010   Dizziness    History of gout 04/25/2012   Hx of hyperparathyroidism due to renal insufficiency (HCC) 10/17/2014   10/16/2014: PTH 79. Ca, P reassuring.   Hypertension    Hypothyroid    Syncope 07/03/2020   Past Surgical  History:  Procedure Laterality Date   ABDOMINAL HYSTERECTOMY     APPENDECTOMY     CHOLECYSTECTOMY     CORONARY ARTERY BYPASS GRAFT     TUBAL LIGATION     History reviewed. No pertinent family history. Social History   Socioeconomic History   Marital status: Widowed    Spouse name: Not on file   Number of children: 4   Years of education: Not on file   Highest education level: Not on file  Occupational History   Not on file  Tobacco Use   Smoking status: Former    Current packs/day: 0.00    Types: Cigarettes    Quit date: 05/09/1994    Years since quitting: 29.0   Smokeless tobacco: Never  Substance and Sexual Activity   Alcohol use: No    Alcohol/week: 0.0 standard drinks of alcohol   Drug use: No   Sexual activity: Not on file  Other Topics Concern   Not on file  Social History Narrative   Has 4 sons, 1 is a Systems analyst and 1 is a mortician-I did not hear the occupations of the other 2.  She has sisters remaining in [place], where she is from.  Enjoys very independent lifestyle and wishes to maintain this independence, including avoiding any dependence on her sons for any reason as she continues to age. (JW 03/24/21)   Social Drivers of Health   Financial Resource Strain: Patient Declined (05/23/2023)   Overall Financial Resource Strain (CARDIA)    Difficulty of Paying Living Expenses: Patient declined  Food Insecurity: No Food Insecurity (05/23/2023)   Hunger Vital Sign    Worried About Running Out of Food in the Last Year: Never true    Ran Out of Food in the Last Year: Never true  Transportation Needs: No Transportation Needs (05/23/2023)   PRAPARE - Administrator, Civil Service (Medical): No    Lack of Transportation (Non-Medical): No  Physical Activity: Inactive (05/23/2023)   Exercise Vital Sign    Days of Exercise per Week: 0 days    Minutes of Exercise per Session: 0 min  Stress: No Stress Concern Present (05/23/2023)   [employer] of  Occupational Health - Occupational Stress Questionnaire    Feeling of Stress : Not at all  Social Connections: Unknown (05/23/2023)   Social Connection and Isolation Panel [NHANES]    Frequency of Communication with Friends and Family: Patient declined    Frequency of Social Gatherings with Friends and Family: Patient declined    Attends Religious Services: Patient declined    Database administrator or Organizations: Not on file    Attends Banker Meetings: Patient declined    Marital Status: Patient declined    Tobacco Counseling Counseling given: Not Answered    Clinical Intake:  Pre-visit preparation completed: Yes  Pain : No/denies pain Pain Score: 0-No pain     BMI - recorded: 25.85 Nutritional Status: BMI 25 -29 Overweight Nutritional Risks: None Diabetes: No  Lab Results  Component Value  Date   HGBA1C 5.8 12/22/2016   HGBA1C 5.8 05/22/2014   HGBA1C 5.9 08/15/2013     How often do you need to have someone help you when you read instructions, pamphlets, or other written materials from your doctor or pharmacy?: 1 - Never  Interpreter Needed?: No  Information entered by :: Maxie Slovacek N. Zack Crager, LPN.   Activities of Daily Living     05/23/2023    9:45 AM 06/15/2022    9:50 AM  In your present state of health, do you have any difficulty performing the following activities:  Hearing? 0 0  Vision? 0 0  Difficulty concentrating or making decisions? 0 0  Walking or climbing stairs? 0 0  Dressing or bathing? 0 0  Doing errands, shopping? 0 0  Preparing Food and eating ? N   Using the Toilet? N   In the past six months, have you accidently leaked urine? N   Do you have problems with loss of bowel control? N   Managing your Medications? N   Managing your Finances? N   Housekeeping or managing your Housekeeping? N     Patient Care Team: Sherol Dixie, MD as PCP - General (Internal Medicine) Ric Chain as Consulting Physician  (Optometry)  Indicate any recent Medical Services you may have received from other than [facility] providers in the past year (date may be approximate).     Assessment:    This is a routine wellness examination for Areil.  Hearing/Vision screen Hearing Screening - Comments:: Denies hearing difficulties.   Vision Screening - Comments:: Wears rx glasses - up to date with routine eye exams with Dr. Dortha Gauss    Goals Addressed             This Visit's Progress    05/23/2023: To stay independent and healthy.         Depression Screen     05/23/2023    9:47 AM 06/15/2022    9:53 AM 02/22/2022    1:28 PM 02/22/2022    9:17 AM 03/24/2021    8:39 AM 01/19/2021    8:53 AM 11/25/2020   11:38 AM  PHQ 2/9 Scores  PHQ - 2 Score 0 0 0 0 0 0 0  PHQ- 9 Score 0     0 0    Fall Risk     05/23/2023    9:45 AM 06/15/2022    9:50 AM 02/22/2022    1:28 PM 02/22/2022    9:12 AM 03/24/2021    8:38 AM  Fall Risk   Falls in the past year? 0 0 1 1 0  Number falls in past yr: 0 0 0 0 0  Injury with Fall? 0 0 0 0 0  Risk for fall due to : No Fall Risks  Impaired balance/gait Impaired balance/gait;Other (Comment)   Risk for fall due to: Comment    passed out   Follow up Falls prevention discussed;Falls evaluation completed Falls evaluation completed Falls evaluation completed;Falls prevention discussed Falls evaluation completed;Falls prevention discussed Falls evaluation completed    MEDICARE RISK AT HOME:  Medicare Risk at Home Any stairs in or around the home?: No If so, are there any without handrails?: No Home free of loose throw rugs in walkways, pet beds, electrical cords, etc?: Yes Adequate lighting in your home to reduce risk of falls?: Yes Life alert?: Yes Use of a cane, walker or w/c?: No Grab bars in the bathroom?: Yes Shower chair or bench in  shower?: Yes Elevated toilet seat or a handicapped toilet?: No  TIMED UP AND GO:  Was the test performed?  No  Cognitive Function: 6CIT  completed    05/23/2023    9:47 AM  MMSE - Mini Mental State Exam  Not completed: Unable to complete        05/23/2023    9:44 AM 02/22/2022    1:28 PM  6CIT Screen  What Year? 0 points 0 points  What month? 0 points 0 points  What time? 0 points 0 points  Count back from 20 0 points 0 points  Months in reverse 0 points 0 points  Repeat phrase 0 points 0 points  Total Score 0 points 0 points    Immunizations Immunization History  Administered Date(s) Administered   Influenza Split 01/24/2011   Influenza, High Dose Seasonal PF 11/25/2020   Influenza,inj,Quad PF,6+ Mos 12/02/2012, 10/16/2014, 12/22/2016, 10/14/2018   PFIZER(Purple Top)SARS-COV-2 Vaccination 04/17/2019, 05/12/2019, 12/13/2019   Pneumococcal Conjugate-13 10/16/2014   Pneumococcal Polysaccharide-23 04/22/2010   Tdap 08/01/2010    Screening Tests Health Maintenance  Topic Date Due   Zoster Vaccines- Shingrix (1 of 2) Never done   DEXA SCAN  Never done   DTaP/Tdap/Td (2 - Td or Tdap) 07/31/2020   COVID-19 Vaccine (4 - 2024-25 season) 09/10/2022   INFLUENZA VACCINE  08/10/2023   Medicare Annual Wellness (AWV)  05/22/2024   Pneumonia Vaccine 53+ Years old  Completed   HPV VACCINES  Aged Out   Meningococcal B Vaccine  Aged Out    Health Maintenance  Health Maintenance Due  Topic Date Due   Zoster Vaccines- Shingrix (1 of 2) Never done   DEXA SCAN  Never done   DTaP/Tdap/Td (2 - Td or Tdap) 07/31/2020   COVID-19 Vaccine (4 - 2024-25 season) 09/10/2022   Health Maintenance Items Addressed: Yes Patient is aware of current care gaps.    Additional Screening:  Vision Screening: Recommended annual ophthalmology exams for early detection of glaucoma and other disorders of the eye.  Dental Screening: Recommended annual dental exams for proper oral hygiene  Community Resource Referral / Chronic Care Management: CRR required this visit?  No   CCM required this visit?  No   Plan:    I have  personally reviewed and noted the following in the patient's chart:   Medical and social history Use of alcohol, tobacco or illicit drugs  Current medications and supplements including opioid prescriptions. Patient is not currently taking opioid prescriptions. Functional ability and status Nutritional status Physical activity Advanced directives List of other physicians Hospitalizations, surgeries, and ER visits in previous 12 months Vitals Screenings to include cognitive, depression, and falls Referrals and appointments  In addition, I have reviewed and discussed with patient certain preventive protocols, quality metrics, and best practice recommendations. A written personalized care plan for preventive services as well as general preventive health recommendations were provided to patient.   Margette Sheldon, LPN   1/61/0960   After Visit Summary: (Mail) Due to this being a telephonic visit, the after visit summary with patients personalized plan was offered to patient via mail   Notes: : Patient aware of current care gaps.  Immunization record was verified by [employer].

## 2023-05-23 NOTE — Patient Instructions (Signed)
 Jill Golden , Thank you for taking time out of your busy schedule to complete your Annual Wellness Visit with me. I enjoyed our conversation and look forward to speaking with you again next year. I, as well as your care team,  appreciate your ongoing commitment to your health goals. Please review the following plan we discussed and let me know if I can assist you in the future. Your Game plan/ To Do List    Referrals: If you haven't heard from the office you've been referred to, please reach out to them at the phone provided.   Follow up Visits: Next Medicare AWV with our clinical staff: 05/28/2024 at 9:10 a.m. PHONE VISIT   Have you seen your provider in the last 6 months (3 months if uncontrolled diabetes)? Yes  Clinician Recommendations:  Aim for 30 minutes of exercise or brisk walking, 6-8 glasses of water, and 5 servings of fruits and vegetables each day.       This is a list of the screening recommended for you and due dates:  Health Maintenance  Topic Date Due   Zoster (Shingles) Vaccine (1 of 2) Never done   DEXA scan (bone density measurement)  Never done   DTaP/Tdap/Td vaccine (2 - Td or Tdap) 07/31/2020   COVID-19 Vaccine (4 - 2024-25 season) 09/10/2022   Flu Shot  08/10/2023   Medicare Annual Wellness Visit  05/22/2024   Pneumonia Vaccine  Completed   HPV Vaccine  Aged Out   Meningitis B Vaccine  Aged Out    Advanced directives: (Provided) Advance directive discussed with you today. I have provided a copy for you to complete at home and have notarized. Once this is complete, please bring a copy in to our office so we can scan it into your chart.  Advance Care Planning is important because it:  [x]  Makes sure you receive the medical care that is consistent with your values, goals, and preferences  [x]  It provides guidance to your family and loved ones and reduces their decisional burden about whether or not they are making the right decisions based on your wishes.  Follow  the link provided in your after visit summary or read over the paperwork we have mailed to you to help you started getting your Advance Directives in place. If you need assistance in completing these, please reach out to us  so that we can help you!  See attachments for Preventive Care and Fall Prevention Tips.

## 2023-05-24 NOTE — Progress Notes (Signed)
 Internal Medicine Attending:  I reviewed the AWV findings of the medical professional who conducted the visit. I was present in the office suite and immediately available to provide assistance and direction throughout the time the service was provided.

## 2023-07-16 ENCOUNTER — Telehealth: Payer: Self-pay | Admitting: Internal Medicine

## 2023-07-16 NOTE — Telephone Encounter (Signed)
 Attempted to contact patient via telephone due to appointment on 08/16/23 with Dr. Mliss Pouch has to be canceled.  Wanted to offer an appointment for tomorrow 07/17/23 in the morning at 9:15 am, but no answer.  Was able to leave detailed message to call the clinic back if she would like to be seen on tomorrow.

## 2023-07-19 ENCOUNTER — Encounter

## 2023-08-16 ENCOUNTER — Ambulatory Visit: Payer: Self-pay | Admitting: Internal Medicine

## 2023-08-23 ENCOUNTER — Ambulatory Visit: Admitting: Internal Medicine

## 2023-08-28 ENCOUNTER — Encounter: Admitting: Internal Medicine

## 2023-09-12 ENCOUNTER — Ambulatory Visit: Payer: Self-pay

## 2023-09-12 NOTE — Telephone Encounter (Signed)
 FYI Only or Action Required?: FYI only for provider.  Patient was last seen in primary care on 05/01/2023 by Trudy Mliss Dragon, MD.  Called Nurse Triage reporting Cough.  Symptoms began last night.  Interventions attempted: Nothing.  Symptoms are: unchanged.  Triage Disposition: Home Care  Patient/caregiver understands and will follow disposition?: Yes   Copied from CRM [id number]. Topic: Clinical - Medical Advice >> Sep 12, 2023  4:10 PM Vivian Z wrote: Reason for CRM: Patient would like a callback regarding a cough which started last night 09/11/23. She would like to know if there's something she can take over the counter. Reason for Disposition  Cough  Answer Assessment - Initial Assessment Questions 1. ONSET: When did the cough begin?      Yesterday - dry hacking cough 2. SEVERITY: How bad is the cough today?      Mild to moderate 3. SPUTUM: Describe the color of your sputum (e.g., none, dry cough; clear, white, yellow, green)     no 4. HEMOPTYSIS: Are you coughing up any blood? If Yes, ask: How much? (e.g., flecks, streaks, tablespoons, etc.)     no 5. DIFFICULTY BREATHING: Are you having difficulty breathing? If Yes, ask: How bad is it? (e.g., mild, moderate, severe)      no 6. FEVER: Do you have a fever? If Yes, ask: What is your temperature, how was it measured, and when did it start?     no 7. CARDIAC HISTORY: Do you have any history of heart disease? (e.g., heart attack, congestive heart failure)      no 8. LUNG HISTORY: Do you have any history of lung disease?  (e.g., pulmonary embolus, asthma, emphysema)     no 9. PE RISK FACTORS: Do you have a history of blood clots? (or: recent major surgery, recent prolonged travel, bedridden)     no 10. OTHER SYMPTOMS: Do you have any other symptoms? (e.g., runny nose, wheezing, chest pain)       no 11. PREGNANCY: Is there any chance you are pregnant? When was your last menstrual period?        n 12. TRAVEL: Have you traveled out of the country in the last month? (e.g., travel history, exposures)       Na   Nurse provided pt with home care advice: pt verbalized understanding.  Protocols used: Cough - Acute Non-Productive-A-AH

## 2023-09-13 NOTE — Telephone Encounter (Signed)
 RTC to patient states the cough has gone.  Is just a little tired.  Tried water and lemon drops and that helped.  No fevers or other concerns.

## 2023-09-20 ENCOUNTER — Ambulatory Visit: Payer: Self-pay | Admitting: Internal Medicine

## 2023-09-24 ENCOUNTER — Other Ambulatory Visit: Payer: Self-pay | Admitting: *Deleted

## 2023-09-24 DIAGNOSIS — E785 Hyperlipidemia, unspecified: Secondary | ICD-10-CM

## 2023-09-24 DIAGNOSIS — E039 Hypothyroidism, unspecified: Secondary | ICD-10-CM

## 2023-09-24 DIAGNOSIS — I10 Essential (primary) hypertension: Secondary | ICD-10-CM

## 2023-09-24 MED ORDER — PRAVASTATIN SODIUM 40 MG PO TABS: 40.0000 mg | ORAL_TABLET | Freq: Every day | ORAL | 3 refills | Status: AC

## 2023-09-24 MED ORDER — AMLODIPINE BESYLATE 10 MG PO TABS: 10.0000 mg | ORAL_TABLET | Freq: Every day | ORAL | 3 refills | Status: AC

## 2023-09-24 MED ORDER — LEVOTHYROXINE SODIUM 50 MCG PO TABS: 50.0000 ug | ORAL_TABLET | Freq: Every day | ORAL | 3 refills | Status: AC

## 2023-09-25 ENCOUNTER — Other Ambulatory Visit: Payer: Self-pay | Admitting: Internal Medicine

## 2023-09-25 DIAGNOSIS — I10 Essential (primary) hypertension: Secondary | ICD-10-CM

## 2023-09-25 MED ORDER — LOSARTAN POTASSIUM 100 MG PO TABS: 100.0000 mg | ORAL_TABLET | Freq: Every day | ORAL | 3 refills | Status: AC

## 2023-09-25 NOTE — Telephone Encounter (Signed)
 Medication sent to pharmacy

## 2023-09-25 NOTE — Telephone Encounter (Signed)
 Copied from CRM [id number]. Topic: Clinical - Medication Refill >> Sep 25, 2023 10:57 AM Merlynn A wrote: Medication: losartan  (COZAAR ) 100 MG tablet  Has the patient contacted their pharmacy? Yes (Agent: If no, request that the patient contact the pharmacy for the refill. If patient does not wish to contact the pharmacy document the reason why and proceed with request.) (Agent: If yes, when and what did the pharmacy advise?)  Patient was advised by pharmacy that medication was not received and to contact provider for refill request.   This is the patient's preferred pharmacy:  [id number] - [place] ([place]), [place] - [address] [place]) [place] [address] Phone: [phone] Fax: [phone]  Is this the correct pharmacy for this prescription? Yes If no, delete pharmacy and type the correct one.   Has the prescription been filled recently? No  Is the patient out of the medication? Yes  Has the patient been seen for an appointment in the last year OR does the patient have an upcoming appointment? Yes  Can we respond through MyChart? No  Agent: Please be advised that Rx refills may take up to 3 business days. We ask that you follow-up with your pharmacy.

## 2023-12-27 ENCOUNTER — Ambulatory Visit: Payer: Self-pay | Admitting: Internal Medicine

## 2024-05-28 ENCOUNTER — Ambulatory Visit
